# Patient Record
Sex: Female | Born: 1990 | Race: Black or African American | Hispanic: No | Marital: Single | Smoking: Current every day smoker
Health system: Southern US, Community
[De-identification: ages and names within clinical notes are randomized; demographics above are authoritative.]

## PROBLEM LIST (undated history)

## (undated) DIAGNOSIS — J45909 Unspecified asthma, uncomplicated: Secondary | ICD-10-CM

## (undated) DIAGNOSIS — I10 Essential (primary) hypertension: Secondary | ICD-10-CM

## (undated) DIAGNOSIS — S7290XA Unspecified fracture of unspecified femur, initial encounter for closed fracture: Secondary | ICD-10-CM

## (undated) HISTORY — DX: Essential (primary) hypertension: I10

## (undated) HISTORY — PX: HIP SURGERY: SHX245

---

## 2017-09-30 ENCOUNTER — Encounter: Payer: Self-pay | Admitting: Emergency Medicine

## 2017-09-30 ENCOUNTER — Emergency Department
Admission: EM | Admit: 2017-09-30 | Discharge: 2017-09-30 | Disposition: A | Discharge status: Home / Self Care | Payer: Self-pay | Attending: Emergency Medicine | Admitting: Emergency Medicine

## 2017-09-30 ENCOUNTER — Other Ambulatory Visit: Payer: Self-pay

## 2017-09-30 DIAGNOSIS — H66001 Acute suppurative otitis media without spontaneous rupture of ear drum, right ear: Secondary | ICD-10-CM | POA: Insufficient documentation

## 2017-09-30 DIAGNOSIS — J069 Acute upper respiratory infection, unspecified: Secondary | ICD-10-CM | POA: Insufficient documentation

## 2017-09-30 DIAGNOSIS — F17290 Nicotine dependence, other tobacco product, uncomplicated: Secondary | ICD-10-CM | POA: Insufficient documentation

## 2017-09-30 MED ORDER — GUAIFENESIN-CODEINE 100-10 MG/5ML PO SOLN: 10.0000 mL | Freq: Three times a day (TID) | ORAL | 0 refills | Status: DC | PRN

## 2017-09-30 MED ORDER — AMOXICILLIN 500 MG PO TABS: 500.0000 mg | ORAL_TABLET | Freq: Three times a day (TID) | ORAL | 0 refills | Status: DC

## 2017-09-30 NOTE — ED Triage Notes (Signed)
Cough x 5 days

## 2017-09-30 NOTE — ED Provider Notes (Signed)
[facility] Emergency Department Provider Note  ____________________________________________  Time seen: Approximately 9:40 AM  I have reviewed the triage vital signs and the nursing notes.   HISTORY  Chief Complaint Cough   HPI Sandra Berger is a 27 y.o. female who presents to the emergency department for treatment and evaluation of cough for the past 5 days.  Patient's states that her child became ill the day before she did.  They were exposed to children who had the same symptoms.  Patient denies fever.  Cough seems to be worse at night.  Over the past 24 hours, her ears have begun to hurt as well. She has not been able to sleep because of the cough. OTC medications have not relieved the pain.  History reviewed. No pertinent past medical history.  There are no active problems to display for this patient.   History reviewed. No pertinent surgical history.  Prior to Admission medications   Medication Sig Start Date End Date Taking? Authorizing Provider  amoxicillin (AMOXIL) 500 MG tablet Take 1 tablet (500 mg total) by mouth 3 (three) times daily. 09/30/17   Codylee Patil B, FNP  guaiFENesin-codeine 100-10 MG/5ML syrup Take 10 mLs by mouth 3 (three) times daily as needed. 09/30/17   Chinita Pesterriplett, Areeb Corron B, FNP    Allergies Percocet [oxycodone-acetaminophen]  No family history on file.  Social History Social History   Tobacco Use  . Smoking status: Current Every Day Smoker    Types: Cigars  Substance Use Topics  . Alcohol use: Not on file  . Drug use: Not on file    Review of Systems Constitutional: Negative for fever/chills ENT: Negative for sore throat. Cardiovascular: Denies chest pain. Respiratory: Negative for shortness of breath. Positive for cough. Gastrointestinal: Negative for nausea,  no vomiting.  Negative for diarrhea.  Musculoskeletal: Negative for body aches Skin: Negative for rash. Neurological: Negative for  headaches ____________________________________________   PHYSICAL EXAM:  VITAL SIGNS: ED Triage Vitals  Enc Vitals Group     BP 09/30/17 0823 (!) 136/91     Pulse Rate 09/30/17 0823 94     Resp 09/30/17 0823 18     Temp 09/30/17 0823 99 F (37.2 C)     Temp Source 09/30/17 0823 Oral     SpO2 09/30/17 0823 98 %     Weight 09/30/17 0825 280 lb (127 kg)     Height 09/30/17 0825 5\' 2"  (1.575 m)     Head Circumference --      Peak Flow --      Pain Score 09/30/17 0825 7     Pain Loc --      Pain Edu? --      Excl. in GC? --     Constitutional: Alert and oriented. Well appearing and in no acute distress. Eyes: Conjunctivae are normal. EOMI. Ears: Right tympanic membrane is erythematous, retracted, and dull. Left tympanic membrane shows serous fluid behind the TM. Nose: Sinus congestion noted; no rhinnorhea. Mouth/Throat: Mucous membranes are moist.  Oropharynx clear. Tonsils normal. Neck: No stridor.  Lymphatic: No cervical lymphadenopathy. Cardiovascular: Normal rate, regular rhythm. Good peripheral circulation. Respiratory: Normal respiratory effort.  No retractions. Breath sounds clear to auscultation. Gastrointestinal: Soft and nontender.  Musculoskeletal: FROM x 4 extremities.  Neurologic:  Normal speech and language.  Skin:  Skin is warm, dry and intact. No rash noted. Psychiatric: Mood and affect are normal. Speech and behavior are normal.  ____________________________________________   LABS (all labs ordered are listed,  but only abnormal results are displayed)  Labs Reviewed - No data to display ____________________________________________  EKG  Not indicated. ____________________________________________  RADIOLOGY  Not indicated. ____________________________________________   PROCEDURES  Procedure(s) performed: None  Critical Care performed: No ____________________________________________   INITIAL IMPRESSION / ASSESSMENT AND PLAN / ED  COURSE  27 year old female presenting to the emergency department for treatment and evaluation of symptoms and exam most concerning for an acute otitis media in the right ear  as a result of an acute upper respiratory infection.  She will be given a prescription for amoxicillin and she was instructed to return to the emergency department for symptoms of change or worsen if she is unable to schedule an appointment.  Guaifenesin with codeine.  She was instructed to follow-up with her primary care provider for symptoms that are not improving over the next few days.  Medications - No data to display  ED Discharge Orders        Ordered    amoxicillin (AMOXIL) 500 MG tablet  3 times daily     09/30/17 0958    guaiFENesin-codeine 100-10 MG/5ML syrup  3 times daily PRN     09/30/17 0958      Pertinent labs & imaging results that were available during my care of the patient were reviewed by me and considered in my medical decision making (see chart for details).    If controlled substance prescribed during this visit, 12 month history viewed on the [facility] prior to issuing an initial prescription for Schedule II or III opiod. ____________________________________________   FINAL CLINICAL IMPRESSION(S) / ED DIAGNOSES  Final diagnoses:  Acute upper respiratory infection  Acute suppurative otitis media of right ear without spontaneous rupture of tympanic membrane, recurrence not specified    Note:  This document was prepared using Dragon voice recognition software and may include unintentional dictation errors.     Chinita Pester, FNP 09/30/17 1354    Myrna Blazer, MD 09/30/17 [phone]

## 2017-09-30 NOTE — ED Notes (Signed)
NAD noted at time of D/C. Pt denies questions or concerns. Pt ambulatory to the lobby at this time. NP made aware of patient's BP, pt instructed to follow up with PCP for BP recheck. Pt states understanding.

## 2018-10-22 ENCOUNTER — Ambulatory Visit
Admission: EM | Admit: 2018-10-22 | Discharge: 2018-10-22 | Disposition: A | Discharge status: Home / Self Care | Payer: Self-pay | Attending: Family Medicine | Admitting: Family Medicine

## 2018-10-22 ENCOUNTER — Other Ambulatory Visit: Payer: Self-pay

## 2018-10-22 DIAGNOSIS — J301 Allergic rhinitis due to pollen: Secondary | ICD-10-CM

## 2018-10-22 DIAGNOSIS — F17210 Nicotine dependence, cigarettes, uncomplicated: Secondary | ICD-10-CM

## 2018-10-22 DIAGNOSIS — R062 Wheezing: Secondary | ICD-10-CM

## 2018-10-22 MED ORDER — ALBUTEROL SULFATE HFA 108 (90 BASE) MCG/ACT IN AERS: 2.0000 | INHALATION_SPRAY | Freq: Four times a day (QID) | RESPIRATORY_TRACT | 0 refills | Status: DC | PRN

## 2018-10-22 NOTE — ED Provider Notes (Signed)
[facility]    CSN: [id number] Arrival date & time: 10/22/18  1201     History   Chief Complaint Chief Complaint  Patient presents with  . Allergies    HPI Sandra Berger is a 28 y.o. female.   28 year old female presents with runny nose, nasal congestion and sneezing that started about 3 to 4 days ago. Also having a mild cough and wheezing due to post nasal drainage. Denies any fever or GI symptoms. She has taken generic Claritin (uncertain if it has the decongestant in it), Dayquil/Nyquil and Benadryl with some relief. Missed work 2 days ago and needs a note to return to work. Has history of asthma and allergies but does not have a current inhaler. No other chronic health issues. Takes no daily medications.   The history is provided by the patient.    History reviewed. No pertinent past medical history.  There are no active problems to display for this patient.   Past Surgical History:  Procedure Laterality Date  . HIP SURGERY Left     OB History   No obstetric history on file.      Home Medications    Prior to Admission medications   Medication Sig Start Date End Date Taking? Authorizing Provider  albuterol (PROVENTIL HFA;VENTOLIN HFA) 108 (90 Base) MCG/ACT inhaler Inhale 2 puffs into the lungs every 6 (six) hours as needed for wheezing. 10/22/18   Alissandra Geoffroy, Ali Lowe, NP    Family History Family History  Problem Relation Age of Onset  . Heart failure Mother   . Irritable bowel syndrome Mother   . Hypertension Mother   . Anxiety disorder Mother     Social History Social History   Tobacco Use  . Smoking status: Current Every Day Smoker    Types: Cigars  . Smokeless tobacco: Never Used  Substance Use Topics  . Alcohol use: Yes    Comment: occasionally  . Drug use: Not Currently     Allergies   Percocet [oxycodone-acetaminophen]   Review of Systems Review of Systems  Constitutional: Positive for appetite change (slight decreased ) and  fatigue. Negative for activity change, chills and fever.  HENT: Positive for congestion (nasal), postnasal drip, rhinorrhea and sneezing. Negative for dental problem, ear discharge, ear pain, facial swelling, mouth sores, nosebleeds, sinus pressure, sinus pain, sore throat and trouble swallowing.   Eyes: Negative for pain, discharge, redness and itching.  Respiratory: Positive for cough and wheezing. Negative for chest tightness and shortness of breath.   Gastrointestinal: Negative for abdominal pain, diarrhea, nausea and vomiting.  Musculoskeletal: Negative for arthralgias, myalgias, neck pain and neck stiffness.  Skin: Negative for color change, rash and wound.  Allergic/Immunologic: Positive for environmental allergies. Negative for immunocompromised state.  Neurological: Negative for dizziness, tremors, seizures, syncope, weakness, light-headedness, numbness and headaches.  Hematological: Negative for adenopathy. Does not bruise/bleed easily.     Physical Exam Triage Vital Signs ED Triage Vitals  Enc Vitals Group     BP 10/22/18 1317 131/75     Pulse Rate 10/22/18 1317 88     Resp 10/22/18 1317 18     Temp 10/22/18 1317 98.2 F (36.8 C)     Temp Source 10/22/18 1317 Oral     SpO2 10/22/18 1317 99 %     Weight 10/22/18 1314 288 lb (130.6 kg)     Height 10/22/18 1314  (1.575 m)     Head Circumference --      Peak  Flow --      Pain Score 10/22/18 1314 2     Pain Loc --      Pain Edu? --      Excl. in GC? --    No data found.  Updated Vital Signs BP 131/75 (BP Location: Right Arm)   Pulse 88   Temp 98.2 F (36.8 C) (Oral)   Resp 18   Ht 5\' 2"  (1.575 m)   Wt 288 lb (130.6 kg)   SpO2 99%   BMI 52.68 kg/m   Visual Acuity Right Eye Distance:   Left Eye Distance:   Bilateral Distance:    Right Eye Near:   Left Eye Near:    Bilateral Near:     Physical Exam Vitals signs and nursing note reviewed.  Constitutional:      General: She is awake. She is not in  acute distress.    Appearance: She is well-developed and well-groomed.     Comments: She is sitting comfortably in exam chair in no acute distress but appears tired.   HENT:     Head: Normocephalic and atraumatic.     Right Ear: Hearing, tympanic membrane, ear canal and external ear normal.     Left Ear: Hearing, tympanic membrane, ear canal and external ear normal.     Nose: Mucosal edema and rhinorrhea (clear) present. Rhinorrhea is clear.     Right Sinus: No maxillary sinus tenderness or frontal sinus tenderness.     Left Sinus: No maxillary sinus tenderness or frontal sinus tenderness.     Mouth/Throat:     Lips: Pink.     Mouth: Mucous membranes are moist.     Pharynx: Oropharynx is clear. Uvula midline. No pharyngeal swelling, oropharyngeal exudate, posterior oropharyngeal erythema or uvula swelling.  Eyes:     Extraocular Movements: Extraocular movements intact.     Conjunctiva/sclera: Conjunctivae normal.  Neck:     Musculoskeletal: Normal range of motion and neck supple. No neck rigidity or muscular tenderness.  Cardiovascular:     Rate and Rhythm: Normal rate and regular rhythm.     Heart sounds: Normal heart sounds. No murmur.  Pulmonary:     Effort: Pulmonary effort is normal. No respiratory distress.     Breath sounds: Normal air entry. No decreased air movement. Examination of the right-upper field reveals wheezing. Examination of the left-upper field reveals wheezing. Examination of the right-lower field reveals wheezing. Examination of the left-lower field reveals wheezing. Wheezing present. No decreased breath sounds, rhonchi or rales.  Musculoskeletal: Normal range of motion.  Lymphadenopathy:     Cervical: No cervical adenopathy.  Skin:    General: Skin is warm and dry.     Capillary Refill: Capillary refill takes less than 2 seconds.  Neurological:     General: No focal deficit present.     Mental Status: She is alert and oriented to person, place, and time.   Psychiatric:        Mood and Affect: Mood normal.        Behavior: Behavior normal. Behavior is cooperative.        Thought Content: Thought content normal.        Judgment: Judgment normal.      UC Treatments / Results  Labs (all labs ordered are listed, but only abnormal results are displayed) Labs Reviewed - No data to display  EKG None  Radiology No results found.  Procedures Procedures (including critical care time)  Medications Ordered in UC Medications -  No data to display  Initial Impression / Assessment and Plan / UC Course  I have reviewed the triage vital signs and the nursing notes.  Pertinent labs & imaging results that were available during my care of the patient were reviewed by me and considered in my medical decision making (see chart for details).    Discussed that she probably has seasonal allergies with mild asthma component. May have a mild viral URI but not contagious at this point since no fever. Recommend use Albuterol inhaler 2 puffs every 6 hours as needed for wheezing. Recommend Claritin-D twice a day as directed. Rest. May use Benadryl at night if needed to help sleep. Note written that she can go back to work as scheduled with no restrictions. Follow-up in 4 to 5 days if not improving.   Final Clinical Impressions(s) / UC Diagnoses   Final diagnoses:  Seasonal allergic rhinitis due to pollen  Wheezing     Discharge Instructions     Recommend start Albuterol inhaler 2 puffs every 6 hours as needed for wheezing or cough. Continue Claritin-D as directed for congestion. Follow-up in 4 to 5 days if not improving.     ED Prescriptions    Medication Sig Dispense Auth. Provider   albuterol (PROVENTIL HFA;VENTOLIN HFA) 108 (90 Base) MCG/ACT inhaler Inhale 2 puffs into the lungs every 6 (six) hours as needed for wheezing. 1 Inhaler Marlies Ligman, Ali Lowe, NP     Controlled Substance Prescriptions [place] Controlled Substance Registry consulted? Not  Applicable   Sudie Grumbling, NP 10/22/18 1757

## 2018-10-22 NOTE — ED Triage Notes (Signed)
Patient complains of runny nose, stuffy nose and a cough that started 3-4 days ago.

## 2018-10-22 NOTE — Discharge Instructions (Addendum)
Recommend start Albuterol inhaler 2 puffs every 6 hours as needed for wheezing or cough. Continue Claritin-D as directed for congestion. Follow-up in 4 to 5 days if not improving.

## 2018-10-25 ENCOUNTER — Encounter: Payer: Self-pay | Admitting: Emergency Medicine

## 2018-10-25 ENCOUNTER — Ambulatory Visit
Admission: EM | Admit: 2018-10-25 | Discharge: 2018-10-25 | Disposition: A | Discharge status: Home / Self Care | Payer: Medicaid Other | Attending: Family Medicine | Admitting: Family Medicine

## 2018-10-25 ENCOUNTER — Other Ambulatory Visit: Payer: Self-pay

## 2018-10-25 DIAGNOSIS — F17210 Nicotine dependence, cigarettes, uncomplicated: Secondary | ICD-10-CM

## 2018-10-25 DIAGNOSIS — J45909 Unspecified asthma, uncomplicated: Secondary | ICD-10-CM

## 2018-10-25 DIAGNOSIS — J309 Allergic rhinitis, unspecified: Secondary | ICD-10-CM

## 2018-10-25 MED ORDER — AZITHROMYCIN 250 MG PO TABS: ORAL_TABLET | ORAL | 0 refills | Status: DC

## 2018-10-25 MED ORDER — METHYLPREDNISOLONE 4 MG PO TBPK: ORAL_TABLET | ORAL | 0 refills | Status: DC

## 2018-10-25 MED ORDER — FLUTICASONE PROPIONATE 50 MCG/ACT NA SUSP: 1.0000 | Freq: Every day | NASAL | 2 refills | Status: DC

## 2018-10-25 NOTE — ED Provider Notes (Signed)
[facility]    CSN: [id number] Arrival date & time: 10/25/18  0825     History   Chief Complaint Chief Complaint  Patient presents with  . Cough    HPI Sandra Berger is a 28 y.o. female.   Patient is a 28 year old female who presents with complaint of cough.  Patient was seen here on March 23 with similar symptoms.  Patient given diagnosis of seasonal allergies as well as a cough.  She is noted to have wheezing in all 4 quadrants then and was given prescription for albuterol.  Patient states she has been taking the albuterol to 3 times a day and it has helped some with her cough on Monday and Tuesday.  However she states the cough got worse yesterday.  Also reporting that she has a sensation of her ears feeling clogged and decreased hearing.  Patient also reports Wednesday and last night but felt like her chest was little heavy with chest pain related to her cough.  Patient reports her cough is non-productive and she has not had any fevers.  Patient reports her daughter has had some similar symptoms.  She does take Claritin daily.  Again patient denies any travel.     History reviewed. No pertinent past medical history.  There are no active problems to display for this patient.   Past Surgical History:  Procedure Laterality Date  . HIP SURGERY Left     OB History   No obstetric history on file.      Home Medications    Prior to Admission medications   Medication Sig Start Date End Date Taking? Authorizing Provider  albuterol (PROVENTIL HFA;VENTOLIN HFA) 108 (90 Base) MCG/ACT inhaler Inhale 2 puffs into the lungs every 6 (six) hours as needed for wheezing. 10/22/18  Yes Amyot, Ali Lowe, NP  azithromycin (ZITHROMAX Z-PAK) 250 MG tablet Take 2 tablets by mouth the first day followed by one tablet daily for next 4 days. 10/25/18   Candis Schatz, PA-C  fluticasone (FLONASE) 50 MCG/ACT nasal spray Place 1 spray into both nostrils daily. 10/25/18   Candis Schatz, PA-C  methylPREDNISolone (MEDROL DOSEPAK) 4 MG TBPK tablet Take per directions on packaging 10/25/18   Candis Schatz, PA-C    Family History Family History  Problem Relation Age of Onset  . Heart failure Mother   . Irritable bowel syndrome Mother   . Hypertension Mother   . Anxiety disorder Mother     Social History Social History   Tobacco Use  . Smoking status: Current Every Day Smoker    Types: Cigars  . Smokeless tobacco: Never Used  Substance Use Topics  . Alcohol use: Yes    Comment: occasionally  . Drug use: Not Currently     Allergies   Percocet [oxycodone-acetaminophen]   Review of Systems Review of Systems  As noted above in HPI.  Other systems reviewed and found to be negative.   Physical Exam Triage Vital Signs ED Triage Vitals  Enc Vitals Group     BP 10/25/18 0838 (!) 146/85     Pulse Rate 10/25/18 0838 (!) 106     Resp 10/25/18 0838 20     Temp 10/25/18 0838 98.8 F (37.1 C)     Temp Source 10/25/18 0838 Oral     SpO2 10/25/18 0838 96 %     Weight 10/25/18 0834 287 lb (130.2 kg)     Height 10/25/18 0834 5\' 2"  (1.575 m)  Head Circumference --      Peak Flow --      Pain Score 10/25/18 0834 2     Pain Loc --      Pain Edu? --      Excl. in GC? --    No data found.  Updated Vital Signs BP (!) 146/85 (BP Location: Left Arm)   Pulse (!) 106   Temp 98.8 F (37.1 C) (Oral)   Resp 20   Ht 5\' 2"  (1.575 m)   Wt 287 lb (130.2 kg)   SpO2 96%   BMI 52.49 kg/m    Physical Exam Constitutional:      Appearance: Normal appearance. She is obese.  HENT:     Right Ear: A middle ear effusion is present.     Left Ear: Tympanic membrane normal.  No middle ear effusion.     Nose:     Right Sinus: No maxillary sinus tenderness or frontal sinus tenderness.     Left Sinus: No maxillary sinus tenderness or frontal sinus tenderness.     Mouth/Throat:     Mouth: Mucous membranes are moist.  Eyes:     Extraocular Movements: Extraocular  movements intact.     Pupils: Pupils are equal, round, and reactive to light.  Cardiovascular:     Rate and Rhythm: Normal rate and regular rhythm.  Pulmonary:     Effort: Pulmonary effort is normal.     Breath sounds: Normal breath sounds. No stridor. No rhonchi.     Comments: Mid inspiratory squeak in all 4 quadrants Abdominal:     Palpations: Abdomen is soft.  Skin:    General: Skin is dry.  Neurological:     General: No focal deficit present.     Mental Status: She is alert and oriented to person, place, and time.  Psychiatric:        Behavior: Behavior normal.      UC Treatments / Results  Labs (all labs ordered are listed, but only abnormal results are displayed) Labs Reviewed - No data to display  EKG None  Radiology No results found.  Procedures Procedures (including critical care time)  Medications Ordered in UC Medications - No data to display  Initial Impression / Assessment and Plan / UC Course  I have reviewed the triage vital signs and the nursing notes.  Pertinent labs & imaging results that were available during my care of the patient were reviewed by me and considered in my medical decision making (see chart for details).    Patient reports worsening cough since she was seen here on Monday.  Despite the albuterol.  Patient continues to have wheezing.  She does have saturation 96% on room air.  Patient also reports some clogging of her right ear.  She does have some middle ear effusion.  Good give her a prescription for Medrol Dosepak as well as azithromycin given her history of asthma.  Have her continue albuterol and will also add Flonase.  Final Clinical Impressions(s) / UC Diagnoses   Final diagnoses:  Asthma, unspecified asthma severity, unspecified whether complicated, unspecified whether persistent  Allergic rhinitis, unspecified seasonality, unspecified trigger     Discharge Instructions     -Continue the albuterol as needed -Flonase: 1  spray to each nostril daily -Azithromycin as directed on the package -Medrol dose pack: Short steroid taper as prescribed on package -Would change her Claritin to nightly    ED Prescriptions    Medication Sig Dispense Auth. Provider  methylPREDNISolone (MEDROL DOSEPAK) 4 MG TBPK tablet Take per directions on packaging 21 tablet Candis Schatz, PA-C   azithromycin (ZITHROMAX Z-PAK) 250 MG tablet Take 2 tablets by mouth the first day followed by one tablet daily for next 4 days. 6 tablet Candis Schatz, PA-C   fluticasone [facility]) 50 MCG/ACT nasal spray Place 1 spray into both nostrils daily. 16 g Candis Schatz, PA-C     Controlled Substance Prescriptions [place] Controlled Substance Registry consulted? Not Applicable   Candis Schatz, PA-C 10/25/18 1478

## 2018-10-25 NOTE — ED Triage Notes (Signed)
Pt c/o cough, chest tightness and wheezing. She was seen on 10/22/18 and told it was allergies. She states that the cough has gotten worse. Denies fever (has had it checked at work), body aches, or chills.

## 2018-10-25 NOTE — Discharge Instructions (Signed)
-  Continue the albuterol as needed -Flonase: 1 spray to each nostril daily -Azithromycin as directed on the package -Medrol dose pack: Short steroid taper as prescribed on package -Would change her Claritin to nightly

## 2019-02-22 ENCOUNTER — Other Ambulatory Visit: Payer: Self-pay

## 2019-02-22 ENCOUNTER — Emergency Department
Admission: EM | Admit: 2019-02-22 | Discharge: 2019-02-22 | Disposition: A | Discharge status: Home / Self Care | Payer: BC Managed Care – PPO | Attending: Emergency Medicine | Admitting: Emergency Medicine

## 2019-02-22 ENCOUNTER — Emergency Department: Payer: BC Managed Care – PPO

## 2019-02-22 DIAGNOSIS — F17290 Nicotine dependence, other tobacco product, uncomplicated: Secondary | ICD-10-CM | POA: Insufficient documentation

## 2019-02-22 DIAGNOSIS — M545 Low back pain, unspecified: Secondary | ICD-10-CM

## 2019-02-22 DIAGNOSIS — Z79899 Other long term (current) drug therapy: Secondary | ICD-10-CM | POA: Diagnosis not present

## 2019-02-22 LAB — URINALYSIS, COMPLETE (UACMP) WITH MICROSCOPIC
Bacteria, UA: NONE SEEN
Bilirubin Urine: NEGATIVE
Glucose, UA: NEGATIVE mg/dL
Hgb urine dipstick: NEGATIVE
Ketones, ur: NEGATIVE mg/dL
Leukocytes,Ua: NEGATIVE
Nitrite: NEGATIVE
Protein, ur: NEGATIVE mg/dL
Specific Gravity, Urine: 1.026 (ref 1.005–1.030)
pH: 6 (ref 5.0–8.0)

## 2019-02-22 LAB — PREGNANCY, URINE: Preg Test, Ur: NEGATIVE

## 2019-02-22 MED ORDER — METHOCARBAMOL 500 MG PO TABS: 500.0000 mg | ORAL_TABLET | Freq: Three times a day (TID) | ORAL | 0 refills | Status: AC | PRN

## 2019-02-22 MED ORDER — KETOROLAC TROMETHAMINE 10 MG PO TABS: 10.0000 mg | ORAL_TABLET | Freq: Four times a day (QID) | ORAL | 0 refills | Status: AC | PRN

## 2019-02-22 MED ORDER — KETOROLAC TROMETHAMINE 30 MG/ML IJ SOLN
30.0000 mg | Freq: Once | INTRAMUSCULAR | Status: AC
  Administered 2019-02-22: 22:00:00 30 mg via INTRAMUSCULAR
  Filled 2019-02-22: qty 1

## 2019-02-22 MED ORDER — METHOCARBAMOL 500 MG PO TABS
500.0000 mg | ORAL_TABLET | Freq: Once | ORAL | Status: AC
  Administered 2019-02-22: 500 mg via ORAL
  Filled 2019-02-22: qty 1

## 2019-02-22 NOTE — ED Provider Notes (Signed)
[facility] Emergency Department Provider Note  ____________________________________________  Time seen: Approximately 10:29 PM  I have reviewed the triage vital signs and the nursing notes.   HISTORY  Chief Complaint Back Pain    HPI Sandra Berger is a 28 y.o. female presents to the emergency department with acute and aching 8 out of 10 low back pain for the past week.  Patient denies falls or mechanisms of trauma.  No radiation of pain to the bilateral lower extremities.  Patient denies dysuria, hematuria, increased urinary frequency, nausea, vomiting or concern for possible pregnancy.  No other alleviating measures have been attempted.        History reviewed. No pertinent past medical history.  There are no active problems to display for this patient.   Past Surgical History:  Procedure Laterality Date  . HIP SURGERY Left     Prior to Admission medications   Medication Sig Start Date End Date Taking? Authorizing Provider  albuterol (PROVENTIL HFA;VENTOLIN HFA) 108 (90 Base) MCG/ACT inhaler Inhale 2 puffs into the lungs every 6 (six) hours as needed for wheezing. 10/22/18   Amyot, Ali LoweAnn Berry, NP  azithromycin (ZITHROMAX Z-PAK) 250 MG tablet Take 2 tablets by mouth the first day followed by one tablet daily for next 4 days. 10/25/18   Candis SchatzHarris, Michael D, PA-C  fluticasone (FLONASE) 50 MCG/ACT nasal spray Place 1 spray into both nostrils daily. 10/25/18   Candis SchatzHarris, Michael D, PA-C  ketorolac (TORADOL) 10 MG tablet Take 1 tablet (10 mg total) by mouth every 6 (six) hours as needed for up to 5 days. 02/22/19 02/27/19  Orvil FeilWoods, Masoud Nyce M, PA-C  methocarbamol (ROBAXIN) 500 MG tablet Take 1 tablet (500 mg total) by mouth every 8 (eight) hours as needed for up to 5 days. 02/22/19 02/27/19  Orvil FeilWoods, Derita Michelsen M, PA-C  methylPREDNISolone (MEDROL DOSEPAK) 4 MG TBPK tablet Take per directions on packaging 10/25/18   Candis SchatzHarris, Michael D, PA-C    Allergies Penicillins and Percocet  [oxycodone-acetaminophen]  Family History  Problem Relation Age of Onset  . Heart failure Mother   . Irritable bowel syndrome Mother   . Hypertension Mother   . Anxiety disorder Mother     Social History Social History   Tobacco Use  . Smoking status: Current Every Day Smoker    Types: Cigars  . Smokeless tobacco: Never Used  Substance Use Topics  . Alcohol use: Yes    Comment: occasionally  . Drug use: Not Currently     Review of Systems  Constitutional: No fever/chills Eyes: No visual changes. No discharge ENT: No upper respiratory complaints. Cardiovascular: no chest pain. Respiratory: no cough. No SOB. Gastrointestinal: No abdominal pain.  No nausea, no vomiting.  No diarrhea.  No constipation. Genitourinary: Negative for dysuria. No hematuria Musculoskeletal: Patient has low back pain.  Skin: Negative for rash, abrasions, lacerations, ecchymosis. Neurological: Negative for headaches, focal weakness or numbness.   ____________________________________________   PHYSICAL EXAM:  VITAL SIGNS: ED Triage Vitals  Enc Vitals Group     BP 02/22/19 1829 (!) 182/107     Pulse Rate 02/22/19 1829 89     Resp 02/22/19 1829 18     Temp 02/22/19 1829 99.7 F (37.6 C)     Temp Source 02/22/19 1829 Oral     SpO2 02/22/19 1829 98 %     Weight 02/22/19 1834 298 lb (135.2 kg)     Height 02/22/19 1834 5\' 2"  (1.575 m)     Head Circumference --  Peak Flow --      Pain Score 02/22/19 1834 8     Pain Loc --      Pain Edu? --      Excl. in [place]? --      Constitutional: Alert and oriented. Well appearing and in no acute distress. Eyes: Conjunctivae are normal. PERRL. EOMI. Head: Atraumatic. Cardiovascular: Normal rate, regular rhythm. Normal S1 and S2.  Good peripheral circulation. Respiratory: Normal respiratory effort without tachypnea or retractions. Lungs CTAB. Good air entry to the bases with no decreased or absent breath sounds. Gastrointestinal: Bowel sounds 4  quadrants. Soft and nontender to palpation. No guarding or rigidity. No palpable masses. No distention. No CVA tenderness. Musculoskeletal: Full range of motion to all extremities. No gross deformities appreciated. Patient has paraspinal muscle tenderness along the lumbar spine.  Neurologic:  Normal speech and language. No gross focal neurologic deficits are appreciated.  Skin:  Skin is warm, dry and intact. No rash noted. Psychiatric: Mood and affect are normal. Speech and behavior are normal. Patient exhibits appropriate insight and judgement.   ____________________________________________   LABS (all labs ordered are listed, but only abnormal results are displayed)  Labs Reviewed  URINALYSIS, COMPLETE (UACMP) WITH MICROSCOPIC - Abnormal; Notable for the following components:      Result Value   Color, Urine YELLOW (*)    APPearance HAZY (*)    All other components within normal limits  PREGNANCY, URINE   ____________________________________________  EKG   ____________________________________________  RADIOLOGY I personally viewed and evaluated these images as part of my medical decision making, as well as reviewing the written report by the radiologist.  Dg Lumbar Spine 2-3 Views  Result Date: 02/22/2019 CLINICAL DATA:  Low back pain for 1 week, no known injury EXAM: LUMBAR SPINE - 2-3 VIEW COMPARISON:  None. FINDINGS: No fracture or dislocation of the lumbar spine. Disc spaces and vertebral body heights are preserved. Normal lumbar lordosis. Unremarkable overlying soft tissues. IMPRESSION: No fracture or dislocation of the lumbar spine. Disc spaces and vertebral body heights are preserved. Normal lumbar lordosis. Unremarkable overlying soft tissues. MRI may be used to further evaluate for lumbar disc and neural foraminal pathology if indicated by localizing neurological signs and symptoms. Electronically Signed   By: Eddie Candle M.D.   On: 02/22/2019 21:32     ____________________________________________    PROCEDURES  Procedure(s) performed:    Procedures    Medications  ketorolac (TORADOL) 30 MG/ML injection 30 mg (30 mg Intramuscular Given 02/22/19 2202)  methocarbamol (ROBAXIN) tablet 500 mg (500 mg Oral Given 02/22/19 2201)     ____________________________________________   INITIAL IMPRESSION / ASSESSMENT AND PLAN / ED COURSE  Pertinent labs & imaging results that were available during my care of the patient were reviewed by me and considered in my medical decision making (see chart for details).  Review of the [place] CSRS was performed in accordance of the [place] prior to dispensing any controlled drugs.         Assessment and plan Low back pain 28 year old female presents to the emergency department with low back pain.  Patient was hypertensive but vital signs were otherwise stable.  Differential diagnosis includes cystitis, pregnancy and low back pain.  Pregnancy testing was negative.  Urinalysis was noncontributory for cystitis.  Patient was given Robaxin and Toradol in the emergency department. Lumbar x ray   She was discharged with Toradol and Robaxin.  All patient questions were answered.  ____________________________________________  FINAL CLINICAL IMPRESSION(S) /  ED DIAGNOSES  Final diagnoses:  Acute bilateral low back pain without sciatica      NEW MEDICATIONS STARTED DURING THIS VISIT:  ED Discharge Orders         Ordered    ketorolac (TORADOL) 10 MG tablet  Every 6 hours PRN     02/22/19 2224    methocarbamol (ROBAXIN) 500 MG tablet  Every 8 hours PRN     02/22/19 2224              This chart was dictated using voice recognition software/Dragon. Despite best efforts to proofread, errors can occur which can change the meaning. Any change was purely unintentional.    Orvil FeilWoods, Khyree Carillo M, PA-C 02/22/19 2243    Minna AntisPaduchowski, Kevin, MD 02/22/19 [phone]

## 2019-02-22 NOTE — ED Triage Notes (Signed)
Pt arrives to ed via POV from home. Ambulatory to triage, Pt c/o back pain starting 1 week ago. Pt reports taking alive, tylenol, gabapentin with no relief. NAD noted during triage.

## 2019-02-22 NOTE — ED Notes (Signed)
E-signature not working at this time. Pt verbalized understanding of D/C instructions and prescriptions. No further questions at this time. Pt ambulatory to lobby in NAD.

## 2019-05-05 ENCOUNTER — Encounter: Payer: Self-pay | Admitting: Emergency Medicine

## 2019-05-05 ENCOUNTER — Ambulatory Visit (INDEPENDENT_AMBULATORY_CARE_PROVIDER_SITE_OTHER): Payer: BC Managed Care – PPO

## 2019-05-05 ENCOUNTER — Other Ambulatory Visit: Payer: Self-pay

## 2019-05-05 ENCOUNTER — Ambulatory Visit
Admission: EM | Admit: 2019-05-05 | Discharge: 2019-05-05 | Disposition: A | Discharge status: Home / Self Care | Payer: BC Managed Care – PPO | Attending: Internal Medicine | Admitting: Internal Medicine

## 2019-05-05 DIAGNOSIS — F17200 Nicotine dependence, unspecified, uncomplicated: Secondary | ICD-10-CM | POA: Diagnosis not present

## 2019-05-05 DIAGNOSIS — R05 Cough: Secondary | ICD-10-CM | POA: Diagnosis not present

## 2019-05-05 DIAGNOSIS — R062 Wheezing: Secondary | ICD-10-CM

## 2019-05-05 DIAGNOSIS — Z1159 Encounter for screening for other viral diseases: Secondary | ICD-10-CM

## 2019-05-05 DIAGNOSIS — R0981 Nasal congestion: Secondary | ICD-10-CM

## 2019-05-05 DIAGNOSIS — R059 Cough, unspecified: Secondary | ICD-10-CM

## 2019-05-05 MED ORDER — ALBUTEROL SULFATE HFA 108 (90 BASE) MCG/ACT IN AERS: 1.0000 | INHALATION_SPRAY | Freq: Four times a day (QID) | RESPIRATORY_TRACT | 0 refills | Status: DC | PRN

## 2019-05-05 MED ORDER — BENZONATATE 200 MG PO CAPS: ORAL_CAPSULE | ORAL | 0 refills | Status: DC

## 2019-05-05 MED ORDER — PREDNISONE 20 MG PO TABS: ORAL_TABLET | ORAL | 0 refills | Status: DC

## 2019-05-05 NOTE — Discharge Instructions (Addendum)
Use a Claritin Allegra or Zyrtec daily.  Also consider using fluticasone nasal spray on a daily basis for 3 to 4 weeks.  If you worsen or not improving follow-up with your primary care physician or the emergency room.

## 2019-05-05 NOTE — ED Triage Notes (Addendum)
Patient in office today c/o cough while cleaning apartment 3d ago and stated that coughing has not stopped with congestion thinks it might be dust allergies.    APO:LIDCVUD, Dimetapp,onecare cold and flu

## 2019-05-05 NOTE — ED Provider Notes (Signed)
[facility]    CSN: [id number] Arrival date & time: 05/05/19  1104      History   Chief Complaint Chief Complaint  Patient presents with  . Cough  . Nasal Congestion    HPI Sandra Berger is a 28 y.o. female.   HPI  28 year old female smoker presents with a cough and wheezing after she cleaned her apartment about 3 days ago.  She states  the cough continues to bother her.  She denies any fever or chills although temperature today is 99.5.  O2 sats on room air 100%.  Pulse rate of 98 blood pressure 134/82.  She has no history of any lung disease.  She states she only smokes about 2 cigars /day.  Has been taking over-the-counter medications including DayQuil Dimetapp cold and flu medications but has not found any benefit.  He denies shortness of breath.  She thinks that she may be allergic to dust.  She states that she usually cleans her Apt. 2 times per year but does not always have the same symptoms.       History reviewed. No pertinent past medical history.  There are no active problems to display for this patient.   Past Surgical History:  Procedure Laterality Date  . HIP SURGERY Left     OB History   No obstetric history on file.      Home Medications    Prior to Admission medications   Medication Sig Start Date End Date Taking? Authorizing Provider  fluticasone (FLONASE) 50 MCG/ACT nasal spray Place 1 spray into both nostrils daily. 10/25/18  Yes Candis Schatz, PA-C  albuterol (VENTOLIN HFA) 108 (90 Base) MCG/ACT inhaler Inhale 1-2 puffs into the lungs every 6 (six) hours as needed for wheezing or shortness of breath. Use with spacer 05/05/19   Lutricia Feil, PA-C  benzonatate (TESSALON) 200 MG capsule Take one cap TID PRN cough 05/05/19   Lutricia Feil, PA-C  predniSONE (DELTASONE) 20 MG tablet Take 2 tablets (40 mg) daily by mouth 05/05/19   Lutricia Feil, PA-C    Family History Family History  Problem Relation Age of Onset  . Heart  failure Mother   . Irritable bowel syndrome Mother   . Hypertension Mother   . Anxiety disorder Mother     Social History Social History   Tobacco Use  . Smoking status: Current Every Day Smoker    Types: Cigars  . Smokeless tobacco: Never Used  Substance Use Topics  . Alcohol use: Yes    Comment: occasionally  . Drug use: Not Currently     Allergies   Penicillins and Percocet [oxycodone-acetaminophen]   Review of Systems Review of Systems  Constitutional: Positive for activity change. Negative for appetite change, chills, fatigue and fever.  HENT: Positive for congestion, ear pain, sinus pressure and sinus pain.   Respiratory: Positive for cough and wheezing. Negative for shortness of breath.   All other systems reviewed and are negative.    Physical Exam Triage Vital Signs ED Triage Vitals  Enc Vitals Group     BP 05/05/19 1122 134/82     Pulse Rate 05/05/19 1122 98     Resp --      Temp 05/05/19 1122 99.5 F (37.5 C)     Temp Source 05/05/19 1122 Oral     SpO2 05/05/19 1122 100 %     Weight 05/05/19 1117 288 lb (130.6 kg)     Height 05/05/19 1117 5\' 2"  (  1.575 m)     Head Circumference --      Peak Flow --      Pain Score 05/05/19 1116 0     Pain Loc --      Pain Edu? --      Excl. in [place]? --    No data found.  Updated Vital Signs BP 134/82 (BP Location: Left Arm)   Pulse 98   Temp 99.5 F (37.5 C) (Oral)   Ht 5\' 2"  (1.575 m)   Wt 288 lb (130.6 kg)   LMP 09/01/2018   SpO2 100%   BMI 52.68 kg/m   Visual Acuity Right Eye Distance:   Left Eye Distance:   Bilateral Distance:    Right Eye Near:   Left Eye Near:    Bilateral Near:     Physical Exam Vitals signs and nursing note reviewed.  Constitutional:      General: She is not in acute distress.    Appearance: Normal appearance. She is obese. She is not ill-appearing, toxic-appearing or diaphoretic.  HENT:     Head: Normocephalic and atraumatic.     Right Ear: Tympanic membrane, ear  canal and external ear normal.     Left Ear: Tympanic membrane, ear canal and external ear normal.     Nose: Nose normal. No congestion or rhinorrhea.     Mouth/Throat:     Mouth: Mucous membranes are moist.     Pharynx: Oropharynx is clear. No oropharyngeal exudate or posterior oropharyngeal erythema.  Eyes:     Conjunctiva/sclera: Conjunctivae normal.  Neck:     Musculoskeletal: Normal range of motion and neck supple. No neck rigidity or muscular tenderness.  Cardiovascular:     Rate and Rhythm: Normal rate and regular rhythm.     Heart sounds: Normal heart sounds.  Pulmonary:     Effort: Pulmonary effort is normal. No respiratory distress.     Breath sounds: No stridor. Wheezing present. No rhonchi or rales.  Musculoskeletal: Normal range of motion.  Lymphadenopathy:     Cervical: No cervical adenopathy.  Skin:    General: Skin is warm and dry.  Neurological:     General: No focal deficit present.     Mental Status: She is alert and oriented to person, place, and time.  Psychiatric:        Mood and Affect: Mood normal.        Behavior: Behavior normal.        Thought Content: Thought content normal.        Judgment: Judgment normal.      UC Treatments / Results  Labs (all labs ordered are listed, but only abnormal results are displayed) Labs Reviewed  NOVEL CORONAVIRUS, NAA (HOSP ORDER, SEND-OUT TO REF LAB; TAT 18-24 HRS)    EKG   Radiology Dg Chest 2 View  Result Date: 05/05/2019 CLINICAL DATA:  Cough and wheezing 4 days. EXAM: CHEST - 2 VIEW COMPARISON:  None. FINDINGS: Lungs are adequately inflated and otherwise clear. Cardiomediastinal silhouette and remainder the exam is unremarkable. IMPRESSION: No active cardiopulmonary disease. Electronically Signed   By: Elberta Fortisaniel  Boyle M.D.   On: 05/05/2019 13:33    Procedures Procedures (including critical care time)  Medications Ordered in UC Medications - No data to display  Initial Impression / Assessment and Plan  / UC Course  I have reviewed the triage vital signs and the nursing notes.  Pertinent labs & imaging results that were available during my care of the patient  were reviewed by me and considered in my medical decision making (see chart for details).   28 year old female presents with the onset of cough nasal congestion that she experienced after cleaning her apartment.  She thought this may be a reaction to the dust stirred up with the cleaning.  She has been afebrile.  She denies any shortness of breath.  The cough is intermittent.  Nonproductive. Does have wheezing on physical exam today.  She denies any previous lung problems.  She has not had any history of asthma.  I prescribed Flonase nasal spray for her nasal congestion given her Tessalon Perles for her cough and have given her an albuterol inhaler for her wheezing.  Use Claritin Allegra or Zyrtec on a daily basis as well.  Have also given her short course of prednisone 40 mg for 4 days.  If she is not improving or worsens she should follow-up with her primary care physician or go to the emergency room.  She of course may return to our clinic at any time.  Final Clinical Impressions(s) / UC Diagnoses   Final diagnoses:  Cough     Discharge Instructions     Use a Claritin Allegra or Zyrtec daily.  Also consider using fluticasone nasal spray on a daily basis for 3 to 4 weeks.  If you worsen or not improving follow-up with your primary care physician or the emergency room.    ED Prescriptions    Medication Sig Dispense Auth. Provider   benzonatate (TESSALON) 200 MG capsule Take one cap TID PRN cough 30 capsule Crecencio Mc P, PA-C   predniSONE (DELTASONE) 20 MG tablet Take 2 tablets (40 mg) daily by mouth 8 tablet Crecencio Mc P, PA-C   albuterol (VENTOLIN HFA) 108 (90 Base) MCG/ACT inhaler Inhale 1-2 puffs into the lungs every 6 (six) hours as needed for wheezing or shortness of breath. Use with spacer 8 g Lorin Picket, PA-C      PDMP not reviewed this encounter.   Lorin Picket, PA-C 05/05/19 1832

## 2019-05-07 LAB — NOVEL CORONAVIRUS, NAA (HOSP ORDER, SEND-OUT TO REF LAB; TAT 18-24 HRS): SARS-CoV-2, NAA: NOT DETECTED

## 2019-05-13 ENCOUNTER — Telehealth: Payer: Self-pay

## 2019-05-13 NOTE — Telephone Encounter (Signed)
TC to [place] with [place]. Notified of positive syphilis results from unknown plasma location. Results faxed to [place]. Aileen Fass, RN

## 2020-03-10 ENCOUNTER — Encounter: Payer: Self-pay | Admitting: Emergency Medicine

## 2020-03-10 ENCOUNTER — Other Ambulatory Visit: Payer: Self-pay

## 2020-03-10 ENCOUNTER — Emergency Department: Payer: Self-pay

## 2020-03-10 DIAGNOSIS — Z7951 Long term (current) use of inhaled steroids: Secondary | ICD-10-CM | POA: Insufficient documentation

## 2020-03-10 DIAGNOSIS — F17210 Nicotine dependence, cigarettes, uncomplicated: Secondary | ICD-10-CM | POA: Insufficient documentation

## 2020-03-10 DIAGNOSIS — S20311A Abrasion of right front wall of thorax, initial encounter: Secondary | ICD-10-CM | POA: Insufficient documentation

## 2020-03-10 DIAGNOSIS — Y92410 Unspecified street and highway as the place of occurrence of the external cause: Secondary | ICD-10-CM | POA: Insufficient documentation

## 2020-03-10 DIAGNOSIS — Y9389 Activity, other specified: Secondary | ICD-10-CM | POA: Insufficient documentation

## 2020-03-10 DIAGNOSIS — S92401A Displaced unspecified fracture of right great toe, initial encounter for closed fracture: Secondary | ICD-10-CM | POA: Insufficient documentation

## 2020-03-10 DIAGNOSIS — Y998 Other external cause status: Secondary | ICD-10-CM | POA: Insufficient documentation
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
## 2020-03-11 ENCOUNTER — Emergency Department
Admission: EM | Admit: 2020-03-11 | Discharge: 2020-03-11 | Disposition: A | Discharge status: Home / Self Care | Payer: Self-pay | Attending: Emergency Medicine | Admitting: Emergency Medicine

## 2020-03-11 DIAGNOSIS — S92401A Displaced unspecified fracture of right great toe, initial encounter for closed fracture: Secondary | ICD-10-CM

## 2020-03-11 DIAGNOSIS — S20311A Abrasion of right front wall of thorax, initial encounter: Secondary | ICD-10-CM

## 2020-03-11 MED ORDER — KETOROLAC TROMETHAMINE 60 MG/2ML IM SOLN
60.0000 mg | Freq: Once | INTRAMUSCULAR | Status: AC
  Administered 2020-03-11: 08:00:00 60 mg via INTRAMUSCULAR
  Filled 2020-03-11: qty 2

## 2020-03-11 MED ORDER — TRAMADOL HCL 50 MG PO TABS: 50.0000 mg | ORAL_TABLET | Freq: Four times a day (QID) | ORAL | 0 refills | Status: DC | PRN

## 2020-03-11 MED ORDER — ACETAMINOPHEN-CODEINE #3 300-30 MG PO TABS
1.0000 | ORAL_TABLET | Freq: Once | ORAL | Status: AC
  Administered 2020-03-11: 08:00:00 1 via ORAL
  Filled 2020-03-11: qty 1
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
## 2020-03-11 NOTE — Discharge Instructions (Signed)
Please take the tramadol 50mg  every 6 hrs as needed. You may also take ibuprofen 600mg  every 6-8 hrs for pain. Please follow up with podiatry for review.
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
## 2020-08-07 ENCOUNTER — Ambulatory Visit
Admission: EM | Admit: 2020-08-07 | Discharge: 2020-08-07 | Disposition: A | Discharge status: Home / Self Care | Payer: Commercial Managed Care - PPO | Attending: Physician Assistant | Admitting: Physician Assistant

## 2020-08-07 ENCOUNTER — Encounter: Payer: Self-pay | Admitting: Emergency Medicine

## 2020-08-07 ENCOUNTER — Other Ambulatory Visit: Payer: Self-pay

## 2020-08-07 DIAGNOSIS — R062 Wheezing: Secondary | ICD-10-CM | POA: Diagnosis not present

## 2020-08-07 DIAGNOSIS — U071 COVID-19: Secondary | ICD-10-CM | POA: Diagnosis not present

## 2020-08-07 DIAGNOSIS — J069 Acute upper respiratory infection, unspecified: Secondary | ICD-10-CM

## 2020-08-07 MED ORDER — PREDNISONE 50 MG PO TABS: ORAL_TABLET | ORAL | 0 refills | Status: DC

## 2020-08-07 MED ORDER — ALBUTEROL SULFATE HFA 108 (90 BASE) MCG/ACT IN AERS: 2.0000 | INHALATION_SPRAY | RESPIRATORY_TRACT | 0 refills | Status: DC | PRN

## 2020-08-07 MED ORDER — BENZONATATE 100 MG PO CAPS: 100.0000 mg | ORAL_CAPSULE | Freq: Three times a day (TID) | ORAL | 0 refills | Status: DC

## 2020-08-07 NOTE — ED Provider Notes (Signed)
[facility]    CSN: [id number] Arrival date & time: 08/07/20  1359      History   Chief Complaint Chief Complaint  Patient presents with  . Headache    HPI Sandra Berger is a 30 y.o. female onset of HA last week and thought it was due to low glucose and resolved. Again had other HA, but was not her sugar this time and resolved, and has come back off and on and developed a cough and nose congestion 5 days ago. Has been very tired. Has been taking mucinex, and other home remedies. Had a fever yesterday but did not check it since her thermometer broke. Has been wheezing. 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   History reviewed. No pertinent past medical history.  There are no problems to display for this patient.   Past Surgical History:  Procedure Laterality Date  . HIP SURGERY Left     OB History   No obstetric history on file.      Home Medications    Prior to Admission medications   Medication Sig Start Date End Date Taking? Authorizing Provider  albuterol (VENTOLIN HFA) 108 (90 Base) MCG/ACT inhaler Inhale 2 puffs into the lungs every 4 (four) hours as needed for wheezing or shortness of breath. 08/07/20  Yes Rodriguez-Southworth, Sunday Spillers, PA-C  benzonatate (TESSALON) 100 MG capsule Take 1 capsule (100 mg total) by mouth every 8 (eight) hours. 08/07/20  Yes Rodriguez-Southworth, Sunday Spillers, PA-C  predniSONE (DELTASONE) 50 MG tablet One qd for wheezing 08/07/20  Yes Rodriguez-Southworth, Sunday Spillers, PA-C  fluticasone (FLONASE) 50 MCG/ACT nasal spray Place 1 spray into both nostrils daily. 10/25/18 08/07/20  Luvenia Redden, PA-C    Family History Family History  Problem Relation Age of Onset  . Heart failure Mother   . Irritable bowel syndrome Mother   . Hypertension Mother   . Anxiety disorder Mother     Social History Social History   Tobacco Use  . Smoking status: Current Every Day Smoker    Types: Cigars  . Smokeless tobacco:  Never Used  Vaping Use  . Vaping Use: Never used  Substance Use Topics  . Alcohol use: Yes    Comment: occasionally  . Drug use: Not Currently     Allergies   Penicillins and Percocet [oxycodone-acetaminophen]   Review of Systems Review of Systems  Constitutional: Positive for chills, fatigue and fever. Negative for appetite change.  HENT: Positive for congestion, postnasal drip, rhinorrhea and sore throat. Negative for ear discharge and ear pain.   Eyes: Negative for discharge.  Respiratory: Positive for cough and wheezing. Negative for chest tightness and shortness of breath.   Cardiovascular: Negative for chest pain.  Gastrointestinal: Negative for diarrhea, nausea and vomiting.  Musculoskeletal: Negative for gait problem and myalgias.  Skin: Negative for rash.  Neurological: Positive for headaches.  Hematological: Negative for adenopathy.   Physical Exam Triage Vital Signs ED Triage Vitals  Enc Vitals Group     BP 08/07/20 1602 (!) 152/93     Pulse Rate 08/07/20 1602 81     Resp 08/07/20 1602 18     Temp 08/07/20 1602 98.6 F (37 C)     Temp Source 08/07/20 1602 Oral     SpO2 08/07/20 1602 100 %     Weight 08/07/20 1603 276 lb 7.3 oz (125.4 kg)     Height 08/07/20 1603 5\' 2"  (1.575 m)     Head Circumference --  Peak Flow --      Pain Score 08/07/20 1603 0     Pain Loc --      Pain Edu? --      Excl. in GC? --    No data found.  Updated Vital Signs BP (!) 152/93 (BP Location: Right Arm)   Pulse 81   Temp 98.6 F (37 C) (Oral)   Resp 18   Ht 5\' 2"  (1.575 m)   Wt 276 lb 7.3 oz (125.4 kg)   SpO2 100%   BMI 50.56 kg/m   Visual Acuity Right Eye Distance:   Left Eye Distance:   Bilateral Distance:    Right Eye Near:   Left Eye Near:    Bilateral Near:     Physical Exam  Physical Exam Vitals signs and nursing note reviewed.  Constitutional:      General: She is not in acute distress.    Appearance: Normal appearance. She is not ill-appearing,  toxic-appearing or diaphoretic.  HENT:     Head: Normocephalic.     Right Ear: Tympanic membrane, ear canal and external ear normal.     Left Ear: Tympanic membrane, ear canal and external ear normal.     Nose: Nose normal.     Mouth/Throat:     Mouth: Mucous membranes are moist.  Eyes:     General: No scleral icterus.       Right eye: No discharge.        Left eye: No discharge.     Conjunctiva/sclera: Conjunctivae normal.  Neck:     Musculoskeletal: Neck supple. No neck rigidity.  Cardiovascular:     Rate and Rhythm: Normal rate and regular rhythm.     Heart sounds: No murmur.  Pulmonary:     Effort: Pulmonary effort is normal.     Breath sounds: has mild wheezing at end of expiration on upper lungs Musculoskeletal: Normal range of motion.  Lymphadenopathy:     Cervical: No cervical adenopathy.  Skin:    General: Skin is warm and dry.     Coloration: Skin is not jaundiced.     Findings: No rash.  Neurological:     Mental Status: She is alert and oriented to person, place, and time.     Gait: Gait normal.  Psychiatric:        Mood and Affect: Mood normal.        Behavior: Behavior normal.        Thought Content: Thought content normal.        Judgment: Judgment normal.     UC Treatments / Results  Labs (all labs ordered are listed, but only abnormal results are displayed) Labs Reviewed  SARS CORONAVIRUS 2 (TAT 6-24 HRS)    EKG   Radiology No results found.  Procedures Procedures (including critical care time)  Medications Ordered in UC Medications - No data to display  Initial Impression / Assessment and Plan / UC Course  I have reviewed the triage vital signs and the nursing notes. URI with viral bronchitis Covid test is pending and will check Mychart for results.  I placed her on Albuterol inhaler, prednisone and tessalon perless.     Final Clinical Impressions(s) / UC Diagnoses   Final diagnoses:  Wheezing  Upper respiratory tract infection,  unspecified type     Discharge Instructions     Stay quarantined until your covid test is back.   If your Covid test ends up positive you may take the following  supplements to help your immune system be stronger to fight this viral infection Take Quarcetin 500 mg three times a day x 7 days with Zinc 50 mg ones a day x 7 days. The quarcetin is an antiviral and anti-inflammatory supplement which helps open the zinc channels in the cell to absorb Zinc. Zinc helps decrease the virus load in your body. Take Melatonin 6-10 mg at bed time which also helps support your immune system.  Also make sure to take Vit D 5,000 IU per day with a fatty meal and Vit C 5000 mg a day until you are completely better. To prevent viral illnesses your vitamin D should be between 60-80. Stay on Vitamin D 2,000  and C  1000 mg the rest of the season.  Don't lay around, keep active and walk as much as you are able to to prevent worsening of your symptoms.  Follow up with your family Dr next week.  If you get short of breath and you are able to check  your oxygen with a pulse oxygen meter, if it gets to 92% or less, you need to go to the hospital to be admitted. If you dont have one, come back here and we will assess you.      ED Prescriptions    Medication Sig Dispense Auth. Provider   predniSONE (DELTASONE) 50 MG tablet One qd for wheezing 5 tablet Rodriguez-Southworth, Nettie Elm, PA-C   albuterol (VENTOLIN HFA) 108 (90 Base) MCG/ACT inhaler Inhale 2 puffs into the lungs every 4 (four) hours as needed for wheezing or shortness of breath. 18 g Rodriguez-Southworth, Remmie Bembenek, PA-C   benzonatate (TESSALON) 100 MG capsule Take 1 capsule (100 mg total) by mouth every 8 (eight) hours. 21 capsule Rodriguez-Southworth, Nettie Elm, PA-C     PDMP not reviewed this encounter.   Garey Ham, Cordelia Poche 08/07/20 2034

## 2020-08-07 NOTE — Discharge Instructions (Signed)
Stay quarantined until your covid test is back.  If your Covid test ends up positive you may take the following supplements to help your immune system be stronger to fight this viral infection Take Quarcetin 500 mg three times a day x 7 days with Zinc 50 mg ones a day x 7 days. The quarcetin is an antiviral and anti-inflammatory supplement which helps open the zinc channels in the cell to absorb Zinc. Zinc helps decrease the virus load in your body. Take Melatonin 6-10 mg at bed time which also helps support your immune system.  Also make sure to take Vit D 5,000 IU per day with a fatty meal and Vit C 5000 mg a day until you are completely better. To prevent viral illnesses your vitamin D should be between 60-80. Stay on Vitamin D 2,000  and C  1000 mg the rest of the season.  Don't lay around, keep active and walk as much as you are able to to prevent worsening of your symptoms.  Follow up with your family Dr next week.  If you get short of breath and you are able to check  your oxygen with a pulse oxygen meter, if it gets to 92% or less, you need to go to the hospital to be admitted. If you dont have one, come back here and we will assess you.   

## 2020-08-07 NOTE — ED Triage Notes (Signed)
Pt c/o headache, cough. Fatigue and nasal congestion. Started about 2 days ago.

## 2020-08-08 LAB — SARS CORONAVIRUS 2 (TAT 6-24 HRS): SARS Coronavirus 2: POSITIVE — AB

## 2020-10-27 ENCOUNTER — Emergency Department (HOSPITAL_COMMUNITY): Payer: Commercial Managed Care - PPO

## 2020-10-27 ENCOUNTER — Encounter (HOSPITAL_COMMUNITY): Payer: Self-pay | Admitting: *Deleted

## 2020-10-27 ENCOUNTER — Emergency Department (HOSPITAL_COMMUNITY)
Admission: EM | Admit: 2020-10-27 | Discharge: 2020-10-27 | Disposition: A | Discharge status: Home / Self Care | Payer: Commercial Managed Care - PPO | Attending: Emergency Medicine | Admitting: Emergency Medicine

## 2020-10-27 ENCOUNTER — Other Ambulatory Visit: Payer: Self-pay

## 2020-10-27 DIAGNOSIS — Y92410 Unspecified street and highway as the place of occurrence of the external cause: Secondary | ICD-10-CM | POA: Insufficient documentation

## 2020-10-27 DIAGNOSIS — F17290 Nicotine dependence, other tobacco product, uncomplicated: Secondary | ICD-10-CM | POA: Diagnosis not present

## 2020-10-27 DIAGNOSIS — J45909 Unspecified asthma, uncomplicated: Secondary | ICD-10-CM | POA: Insufficient documentation

## 2020-10-27 DIAGNOSIS — Z7952 Long term (current) use of systemic steroids: Secondary | ICD-10-CM | POA: Diagnosis not present

## 2020-10-27 DIAGNOSIS — M25511 Pain in right shoulder: Secondary | ICD-10-CM | POA: Insufficient documentation

## 2020-10-27 HISTORY — DX: Unspecified asthma, uncomplicated: J45.909

## 2020-10-27 HISTORY — DX: Unspecified fracture of unspecified femur, initial encounter for closed fracture: S72.90XA

## 2020-10-27 MED ORDER — IBUPROFEN 400 MG PO TABS
600.0000 mg | ORAL_TABLET | Freq: Once | ORAL | Status: AC
  Administered 2020-10-27: 600 mg via ORAL
  Filled 2020-10-27: qty 1
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
## 2020-10-27 NOTE — Discharge Instructions (Signed)
You can take 600 mg of ibuprofen every 6 hours, you can take 1000 mg of Tylenol every 6 hours, you can alternate these every 3 or you can take them together.  

## 2020-10-27 NOTE — Social Work (Signed)
CSW arranged for transportation via [employer].  CSW updated Pt.
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
## 2021-07-16 ENCOUNTER — Other Ambulatory Visit: Payer: Self-pay

## 2021-07-16 ENCOUNTER — Ambulatory Visit
Admission: EM | Admit: 2021-07-16 | Discharge: 2021-07-16 | Disposition: A | Discharge status: Home / Self Care | Payer: Commercial Managed Care - PPO | Attending: Emergency Medicine | Admitting: Emergency Medicine

## 2021-07-16 DIAGNOSIS — J069 Acute upper respiratory infection, unspecified: Secondary | ICD-10-CM

## 2021-07-16 DIAGNOSIS — R051 Acute cough: Secondary | ICD-10-CM

## 2021-07-16 DIAGNOSIS — H66001 Acute suppurative otitis media without spontaneous rupture of ear drum, right ear: Secondary | ICD-10-CM

## 2021-07-16 MED ORDER — BENZONATATE 100 MG PO CAPS: 200.0000 mg | ORAL_CAPSULE | Freq: Three times a day (TID) | ORAL | 0 refills | Status: DC | PRN

## 2021-07-16 MED ORDER — PROMETHAZINE-DM 6.25-15 MG/5ML PO SYRP: 5.0000 mL | ORAL_SOLUTION | Freq: Four times a day (QID) | ORAL | 0 refills | Status: DC | PRN

## 2021-07-16 MED ORDER — DOXYCYCLINE HYCLATE 100 MG PO CAPS: 100.0000 mg | ORAL_CAPSULE | Freq: Two times a day (BID) | ORAL | 0 refills | Status: DC

## 2021-07-16 MED ORDER — IPRATROPIUM BROMIDE 0.06 % NA SOLN: 2.0000 | Freq: Four times a day (QID) | NASAL | 12 refills | Status: DC

## 2021-07-16 MED ORDER — ALBUTEROL SULFATE HFA 108 (90 BASE) MCG/ACT IN AERS: 2.0000 | INHALATION_SPRAY | RESPIRATORY_TRACT | 0 refills | Status: DC | PRN

## 2021-07-16 NOTE — Discharge Instructions (Signed)
Take the Doxycycline twice daily for 10 days with food for treatment of your ear infection.  Take an over-the-counter probiotic 1 hour after each dose of antibiotic to prevent diarrhea.  Use over-the-counter Tylenol and ibuprofen as needed for pain or fever.  Place a hot water bottle, or heating pad, underneath your pillowcase at night to help dilate up your ear and aid in pain relief as well as resolution of the infection.  Use the Atrovent nasal spray, 2 squirts in each nostril every 6 hours, as needed for runny nose and postnasal drip.  Use the Tessalon Perles every 8 hours during the day.  Take them with a small sip of water.  They may give you some numbness to the base of your tongue or a metallic taste in your mouth, this is normal.  Use the Promethazine DM cough syrup at bedtime for cough and congestion.  It will make you drowsy so do not take it during the day.  Return for reevaluation or see your primary care provider for any new or worsening symptoms.

## 2021-07-16 NOTE — ED Provider Notes (Signed)
[facility]    CSN: [id number] Arrival date & time: 07/16/21  1024      History   Chief Complaint Chief Complaint  Patient presents with   Cough    HPI Sandra Berger is a 30 y.o. female.   HPI  30 year old female here for evaluation of cough.  Patient ports that she has been experiencing a cough for last couple of days that increases at night and improves during the day.  This has been associated with runny nose and nasal congestion, sore throat, ear pressure, and wheezing.  She does have a history of asthma and diabetes.  She denies fever, shortness of breath, or GI symptoms.  Her daughter does have similar symptoms to hers.  Past Medical History:  Diagnosis Date   Asthma    Fracture, femur (HCC)     There are no problems to display for this patient.   Past Surgical History:  Procedure Laterality Date   HIP SURGERY Left     OB History   No obstetric history on file.      Home Medications    Prior to Admission medications   Medication Sig Start Date End Date Taking? Authorizing Provider  doxycycline (VIBRAMYCIN) 100 MG capsule Take 1 capsule (100 mg total) by mouth 2 (two) times daily. 07/16/21  Yes Becky Augusta, NP  ipratropium (ATROVENT) 0.06 % nasal spray Place 2 sprays into both nostrils 4 (four) times daily. 07/16/21  Yes Becky Augusta, NP  promethazine-dextromethorphan (PROMETHAZINE-DM) 6.25-15 MG/5ML syrup Take 5 mLs by mouth 4 (four) times daily as needed. 07/16/21  Yes Becky Augusta, NP  albuterol (VENTOLIN HFA) 108 (90 Base) MCG/ACT inhaler Inhale 2 puffs into the lungs every 4 (four) hours as needed for wheezing or shortness of breath. 07/16/21   Becky Augusta, NP  benzonatate (TESSALON) 100 MG capsule Take 2 capsules (200 mg total) by mouth 3 (three) times daily as needed for cough. 07/16/21   Becky Augusta, NP  fluticasone (FLONASE) 50 MCG/ACT nasal spray Place 1 spray into both nostrils daily. 10/25/18 08/07/20  Candis Schatz, PA-C     Family History Family History  Problem Relation Age of Onset   Heart failure Mother    Irritable bowel syndrome Mother    Hypertension Mother    Anxiety disorder Mother     Social History Social History   Tobacco Use   Smoking status: Every Day    Types: Cigars   Smokeless tobacco: Never  Vaping Use   Vaping Use: Never used  Substance Use Topics   Alcohol use: Yes    Comment: occasionally   Drug use: Not Currently     Allergies   Penicillins and Percocet [oxycodone-acetaminophen]   Review of Systems Review of Systems  Constitutional:  Negative for activity change, appetite change and fever.  HENT:  Positive for congestion, ear pain, rhinorrhea and sore throat.   Respiratory:  Positive for cough and wheezing. Negative for shortness of breath.   Gastrointestinal:  Negative for diarrhea, nausea and vomiting.  Skin:  Negative for rash.  Hematological: Negative.   Psychiatric/Behavioral: Negative.      Physical Exam Triage Vital Signs ED Triage Vitals  Enc Vitals Group     BP 07/16/21 1208 (!) 155/107     Pulse Rate 07/16/21 1208 97     Resp 07/16/21 1208 18     Temp 07/16/21 1208 98.7 F (37.1 C)     Temp Source 07/16/21 1208 Oral     SpO2  07/16/21 1208 99 %     Weight 07/16/21 1207 (!) 315 lb (142.9 kg)     Height 07/16/21 1207 5\' 2"  (1.575 m)     Head Circumference --      Peak Flow --      Pain Score 07/16/21 1207 0     Pain Loc --      Pain Edu? --      Excl. in GC? --    No data found.  Updated Vital Signs BP (!) 155/107 (BP Location: Right Arm)    Pulse 97    Temp 98.7 F (37.1 C) (Oral)    Resp 18    Ht 5\' 2"  (1.575 m)    Wt (!) 315 lb (142.9 kg)    SpO2 99%    BMI 57.61 kg/m   Visual Acuity Right Eye Distance:   Left Eye Distance:   Bilateral Distance:    Right Eye Near:   Left Eye Near:    Bilateral Near:     Physical Exam Vitals and nursing note reviewed.  Constitutional:      Appearance: Normal appearance. She is obese. She  is not ill-appearing.  HENT:     Head: Normocephalic and atraumatic.     Right Ear: Ear canal and external ear normal. There is no impacted cerumen.     Left Ear: Tympanic membrane, ear canal and external ear normal. There is no impacted cerumen.     Ears:     Comments: Right tympanic membrane is erythematous and injected.    Nose: Congestion and rhinorrhea present.     Mouth/Throat:     Mouth: Mucous membranes are moist.     Pharynx: Oropharynx is clear. Posterior oropharyngeal erythema present.  Cardiovascular:     Rate and Rhythm: Normal rate and regular rhythm.     Pulses: Normal pulses.     Heart sounds: Normal heart sounds. No murmur heard.   No gallop.  Pulmonary:     Effort: Pulmonary effort is normal.     Breath sounds: Normal breath sounds. No wheezing, rhonchi or rales.  Musculoskeletal:     Cervical back: Normal range of motion and neck supple.  Lymphadenopathy:     Cervical: No cervical adenopathy.  Skin:    General: Skin is warm and dry.     Capillary Refill: Capillary refill takes less than 2 seconds.     Findings: No erythema or rash.  Neurological:     General: No focal deficit present.     Mental Status: She is alert and oriented to person, place, and time.  Psychiatric:        Mood and Affect: Mood normal.        Behavior: Behavior normal.        Thought Content: Thought content normal.        Judgment: Judgment normal.     UC Treatments / Results  Labs (all labs ordered are listed, but only abnormal results are displayed) Labs Reviewed - No data to display  EKG   Radiology No results found.  Procedures Procedures (including critical care time)  Medications Ordered in UC Medications - No data to display  Initial Impression / Assessment and Plan / UC Course  I have reviewed the triage vital signs and the nursing notes.  Pertinent labs & imaging results that were available during my care of the patient were reviewed by me and considered in my  medical decision making (see chart for details).  Is  a nontoxic-appearing 30 year old female here for evaluation of respiratory complaints as outlined HPI above.  Her most prominent complaint is a cough that increases at nighttime when she lays down.  On physical exam patient has an erythematous and injected right tympanic membrane.  Left tympanic membrane is pearly gray with normal light reflex.  Both external auditory canals are clear.  Nasal mucosa is erythematous and edematous with scant clear nasal discharge in both nares.  Posterior oropharynx is erythematous with mild injection and clear postnasal drip.  No cervical lymphadenopathy appreciated exam.  Cardiopulmonary exam reveals clear lung sounds in all fields.  Patient is requesting refill of her albuterol inhaler which I am happy to do to help with wheezing.  She has a penicillin allergy and has never taken a cephalosporin.  We will treat her otitis media with doxycycline twice daily for 10 days.  We will also give her Atrovent nasal spray to help with nasal congestion, Tessalon Perles and Promethazine DM cough syrup to help with cough and congestion.  Patient denies need for work note.   Final Clinical Impressions(s) / UC Diagnoses   Final diagnoses:  Upper respiratory tract infection, unspecified type  Acute cough  Non-recurrent acute suppurative otitis media of right ear without spontaneous rupture of tympanic membrane     Discharge Instructions      Take the Doxycycline twice daily for 10 days with food for treatment of your ear infection.  Take an over-the-counter probiotic 1 hour after each dose of antibiotic to prevent diarrhea.  Use over-the-counter Tylenol and ibuprofen as needed for pain or fever.  Place a hot water bottle, or heating pad, underneath your pillowcase at night to help dilate up your ear and aid in pain relief as well as resolution of the infection.  Use the Atrovent nasal spray, 2 squirts in each nostril  every 6 hours, as needed for runny nose and postnasal drip.  Use the Tessalon Perles every 8 hours during the day.  Take them with a small sip of water.  They may give you some numbness to the base of your tongue or a metallic taste in your mouth, this is normal.  Use the Promethazine DM cough syrup at bedtime for cough and congestion.  It will make you drowsy so do not take it during the day.  Return for reevaluation or see your primary care provider for any new or worsening symptoms.      ED Prescriptions     Medication Sig Dispense Auth. Provider   albuterol (VENTOLIN HFA) 108 (90 Base) MCG/ACT inhaler Inhale 2 puffs into the lungs every 4 (four) hours as needed for wheezing or shortness of breath. 18 g Becky Augusta, NP   benzonatate (TESSALON) 100 MG capsule Take 2 capsules (200 mg total) by mouth 3 (three) times daily as needed for cough. 21 capsule Becky Augusta, NP   doxycycline (VIBRAMYCIN) 100 MG capsule Take 1 capsule (100 mg total) by mouth 2 (two) times daily. 20 capsule Becky Augusta, NP   promethazine-dextromethorphan (PROMETHAZINE-DM) 6.25-15 MG/5ML syrup Take 5 mLs by mouth 4 (four) times daily as needed. 118 mL Becky Augusta, NP   ipratropium (ATROVENT) 0.06 % nasal spray Place 2 sprays into both nostrils 4 (four) times daily. 15 mL Becky Augusta, NP      PDMP not reviewed this encounter.   Becky Augusta, NP 07/16/21 1246

## 2021-07-16 NOTE — ED Triage Notes (Signed)
Pt here with C/O cough for couple days, worst at night.

## 2021-07-30 ENCOUNTER — Emergency Department
Admission: EM | Admit: 2021-07-30 | Discharge: 2021-07-30 | Discharge status: Against Medical Advice | Payer: Commercial Managed Care - PPO | Source: Home / Self Care

## 2021-08-25 ENCOUNTER — Ambulatory Visit
Admission: EM | Admit: 2021-08-25 | Discharge: 2021-08-25 | Disposition: A | Discharge status: Home / Self Care | Payer: Commercial Managed Care - PPO | Attending: Medical Oncology | Admitting: Medical Oncology

## 2021-08-25 ENCOUNTER — Ambulatory Visit (INDEPENDENT_AMBULATORY_CARE_PROVIDER_SITE_OTHER): Payer: Commercial Managed Care - PPO

## 2021-08-25 ENCOUNTER — Other Ambulatory Visit: Payer: Self-pay

## 2021-08-25 DIAGNOSIS — R509 Fever, unspecified: Secondary | ICD-10-CM | POA: Diagnosis not present

## 2021-08-25 DIAGNOSIS — H9203 Otalgia, bilateral: Secondary | ICD-10-CM | POA: Diagnosis not present

## 2021-08-25 DIAGNOSIS — J3489 Other specified disorders of nose and nasal sinuses: Secondary | ICD-10-CM | POA: Diagnosis not present

## 2021-08-25 DIAGNOSIS — Z20822 Contact with and (suspected) exposure to covid-19: Secondary | ICD-10-CM | POA: Diagnosis not present

## 2021-08-25 DIAGNOSIS — R051 Acute cough: Secondary | ICD-10-CM | POA: Diagnosis not present

## 2021-08-25 DIAGNOSIS — R059 Cough, unspecified: Secondary | ICD-10-CM

## 2021-08-25 DIAGNOSIS — R0981 Nasal congestion: Secondary | ICD-10-CM | POA: Insufficient documentation

## 2021-08-25 DIAGNOSIS — H5789 Other specified disorders of eye and adnexa: Secondary | ICD-10-CM | POA: Insufficient documentation

## 2021-08-25 DIAGNOSIS — R0989 Other specified symptoms and signs involving the circulatory and respiratory systems: Secondary | ICD-10-CM | POA: Insufficient documentation

## 2021-08-25 LAB — CBC WITH DIFFERENTIAL/PLATELET
Abs Immature Granulocytes: 0.05 10*3/uL (ref 0.00–0.07)
Basophils Absolute: 0 10*3/uL (ref 0.0–0.1)
Basophils Relative: 0 %
Eosinophils Absolute: 0 10*3/uL (ref 0.0–0.5)
Eosinophils Relative: 0 %
HCT: 40.6 % (ref 36.0–46.0)
Hemoglobin: 13.4 g/dL (ref 12.0–15.0)
Immature Granulocytes: 1 %
Lymphocytes Relative: 28 %
Lymphs Abs: 2.8 10*3/uL (ref 0.7–4.0)
MCH: 28.7 pg (ref 26.0–34.0)
MCHC: 33 g/dL (ref 30.0–36.0)
MCV: 86.9 fL (ref 80.0–100.0)
Monocytes Absolute: 1.1 10*3/uL — ABNORMAL HIGH (ref 0.1–1.0)
Monocytes Relative: 11 %
Neutro Abs: 6.1 10*3/uL (ref 1.7–7.7)
Neutrophils Relative %: 60 %
Platelets: 260 10*3/uL (ref 150–400)
RBC: 4.67 MIL/uL (ref 3.87–5.11)
RDW: 14.5 % (ref 11.5–15.5)
WBC: 10.1 10*3/uL (ref 4.0–10.5)
nRBC: 0 % (ref 0.0–0.2)

## 2021-08-25 LAB — RESP PANEL BY RT-PCR (FLU A&B, COVID) ARPGX2
Influenza A by PCR: NEGATIVE
Influenza B by PCR: NEGATIVE
SARS Coronavirus 2 by RT PCR: NEGATIVE

## 2021-08-25 MED ORDER — BENZONATATE 100 MG PO CAPS: 100.0000 mg | ORAL_CAPSULE | Freq: Three times a day (TID) | ORAL | 0 refills | Status: DC

## 2021-08-25 MED ORDER — DOXYCYCLINE HYCLATE 100 MG PO CAPS: 100.0000 mg | ORAL_CAPSULE | Freq: Two times a day (BID) | ORAL | 0 refills | Status: DC

## 2021-08-25 NOTE — ED Triage Notes (Signed)
Pt here with C/O right ear pain (last night), eye redness for 4 days, chest congestion for 1 month. Pt was seen here 1 month ago, pt states she is still coughing. Pt was seen on virtual visit on Monday and was given eye drops and Z-pack for throat.

## 2021-08-25 NOTE — ED Provider Notes (Signed)
[facility]    CSN: [id number] Arrival date & time: 08/25/21  7680      History   Chief Complaint Chief Complaint  Patient presents with   Otalgia   Cough    HPI Sandra Berger is a 31 y.o. female.   HPI  Cold Symptoms: Patient reports that they have had symptoms of bilateral ear pain, eye redness, chest congestion and cough. Ear pain and eye redness has been present for 4 days and the cough has been present for the past month days. Symptoms are worsen. They deny SOB, chest pain,or vomiting. They have tried rx eye drops and azithromycin which was started 2 days ago for symptoms. Symptoms have not improved. No known sick contacts.    Past Medical History:  Diagnosis Date   Asthma    Fracture, femur (HCC)     There are no problems to display for this patient.   Past Surgical History:  Procedure Laterality Date   HIP SURGERY Left     OB History   No obstetric history on file.      Home Medications    Prior to Admission medications   Medication Sig Start Date End Date Taking? Authorizing Provider  albuterol (VENTOLIN HFA) 108 (90 Base) MCG/ACT inhaler Inhale 2 puffs into the lungs every 4 (four) hours as needed for wheezing or shortness of breath. 07/16/21  Yes Becky Augusta, NP  azithromycin (ZITHROMAX) 250 MG tablet Take by mouth. 08/23/21  Yes [provider]  ipratropium (ATROVENT) 0.06 % nasal spray Place 2 sprays into both nostrils 4 (four) times daily. 07/16/21  Yes Becky Augusta, NP  benzonatate (TESSALON) 100 MG capsule Take 2 capsules (200 mg total) by mouth 3 (three) times daily as needed for cough. 07/16/21   Becky Augusta, NP  doxycycline (VIBRAMYCIN) 100 MG capsule Take 1 capsule (100 mg total) by mouth 2 (two) times daily. 07/16/21   Becky Augusta, NP  promethazine-dextromethorphan (PROMETHAZINE-DM) 6.25-15 MG/5ML syrup Take 5 mLs by mouth 4 (four) times daily as needed. 07/16/21   Becky Augusta, NP  fluticasone (FLONASE) 50 MCG/ACT nasal  spray Place 1 spray into both nostrils daily. 10/25/18 08/07/20  Candis Schatz, PA-C    Family History Family History  Problem Relation Age of Onset   Heart failure Mother    Irritable bowel syndrome Mother    Hypertension Mother    Anxiety disorder Mother     Social History Social History   Tobacco Use   Smoking status: Every Day    Types: Cigars   Smokeless tobacco: Never  Vaping Use   Vaping Use: Never used  Substance Use Topics   Alcohol use: Yes    Comment: occasionally   Drug use: Not Currently     Allergies   Penicillins and Percocet [oxycodone-acetaminophen]   Review of Systems Review of Systems  As stated above in HPI Physical Exam Triage Vital Signs ED Triage Vitals  Enc Vitals Group     BP 08/25/21 0833 (!) 143/95     Pulse Rate 08/25/21 0833 (!) 120     Resp 08/25/21 0833 (!) 22     Temp 08/25/21 0833 (!) 100.6 F (38.1 C)     Temp Source 08/25/21 0833 Oral     SpO2 08/25/21 0833 96 %     Weight 08/25/21 0828 (!) 315 lb (142.9 kg)     Height 08/25/21 0828 5\' 2"  (1.575 m)     Head Circumference --  Peak Flow --      Pain Score 08/25/21 0830 7     Pain Loc --      Pain Edu? --      Excl. in GC? --    No data found.  Updated Vital Signs BP (!) 143/95 (BP Location: Left Arm)    Pulse (!) 120    Temp (!) 100.6 F (38.1 C) (Oral)    Resp (!) 22    Ht 5\' 2"  (1.575 m)    Wt (!) 315 lb (142.9 kg)    SpO2 96%    BMI 57.61 kg/m   Physical Exam Vitals and nursing note reviewed.  Constitutional:      General: She is not in acute distress.    Appearance: Normal appearance. She is not ill-appearing, toxic-appearing or diaphoretic.  HENT:     Head: Normocephalic and atraumatic.     Right Ear: Tympanic membrane normal.     Left Ear: Tympanic membrane normal.     Nose: Congestion and rhinorrhea present.     Mouth/Throat:     Mouth: Mucous membranes are moist.     Pharynx: Oropharynx is clear. No oropharyngeal exudate or posterior oropharyngeal  erythema.  Eyes:     General:        Right eye: No discharge.        Left eye: No discharge.     Extraocular Movements: Extraocular movements intact.     Conjunctiva/sclera: Conjunctivae normal.     Pupils: Pupils are equal, round, and reactive to light.  Cardiovascular:     Rate and Rhythm: Regular rhythm. Tachycardia present.     Heart sounds: Normal heart sounds.     Comments: HR 110 on exam Pulmonary:     Effort: Pulmonary effort is normal. No respiratory distress.     Breath sounds: Normal breath sounds. No stridor. No wheezing or rhonchi.  Abdominal:     Palpations: Abdomen is soft.  Musculoskeletal:     Cervical back: Normal range of motion and neck supple.  Lymphadenopathy:     Cervical: No cervical adenopathy.  Skin:    General: Skin is warm.     Coloration: Skin is not jaundiced.     Findings: No erythema or rash.  Neurological:     Mental Status: She is alert and oriented to person, place, and time.     UC Treatments / Results  Labs (all labs ordered are listed, but only abnormal results are displayed) Labs Reviewed - No data to display  EKG   Radiology No results found.  Procedures Procedures (including critical care time)  Medications Ordered in UC Medications - No data to display  Initial Impression / Assessment and Plan / UC Course  I have reviewed the triage vital signs and the nursing notes.  Pertinent labs & imaging results that were available during my care of the patient were reviewed by me and considered in my medical decision making (see chart for details).     New. Chest x ray given history, vitals and exam findings. UPDATE: Chest x ray and CBC are reassuring however I am concerned about her x ray when I view it along with vitals. Given this we discussed a watch and wait approach vs adding doxycyline. She elects Doxycyline. Discussed red flag signs and symptoms. Follow up PRN.    Final Clinical Impressions(s) / UC Diagnoses   Final  diagnoses:  None   Discharge Instructions   None    ED Prescriptions  None    PDMP not reviewed this encounter.   Rushie ChestnutCovington, Gelila Well M, [place] JerseyPA-C 08/25/21 1049

## 2022-07-26 ENCOUNTER — Ambulatory Visit
Admission: EM | Admit: 2022-07-26 | Discharge: 2022-07-26 | Disposition: A | Discharge status: Home / Self Care | Payer: BC Managed Care – PPO | Attending: Internal Medicine | Admitting: Internal Medicine

## 2022-07-26 ENCOUNTER — Ambulatory Visit (INDEPENDENT_AMBULATORY_CARE_PROVIDER_SITE_OTHER): Payer: BC Managed Care – PPO

## 2022-07-26 DIAGNOSIS — A084 Viral intestinal infection, unspecified: Secondary | ICD-10-CM

## 2022-07-26 DIAGNOSIS — J111 Influenza due to unidentified influenza virus with other respiratory manifestations: Secondary | ICD-10-CM | POA: Insufficient documentation

## 2022-07-26 DIAGNOSIS — Z1152 Encounter for screening for COVID-19: Secondary | ICD-10-CM | POA: Diagnosis not present

## 2022-07-26 LAB — CBC WITH DIFFERENTIAL/PLATELET
Abs Immature Granulocytes: 0.01 10*3/uL (ref 0.00–0.07)
Basophils Absolute: 0 10*3/uL (ref 0.0–0.1)
Basophils Relative: 0 %
Eosinophils Absolute: 0 10*3/uL (ref 0.0–0.5)
Eosinophils Relative: 0 %
HCT: 47.2 % — ABNORMAL HIGH (ref 36.0–46.0)
Hemoglobin: 15.7 g/dL — ABNORMAL HIGH (ref 12.0–15.0)
Immature Granulocytes: 0 %
Lymphocytes Relative: 56 %
Lymphs Abs: 2.5 10*3/uL (ref 0.7–4.0)
MCH: 29 pg (ref 26.0–34.0)
MCHC: 33.3 g/dL (ref 30.0–36.0)
MCV: 87.2 fL (ref 80.0–100.0)
Monocytes Absolute: 0.6 10*3/uL (ref 0.1–1.0)
Monocytes Relative: 14 %
Neutro Abs: 1.4 10*3/uL — ABNORMAL LOW (ref 1.7–7.7)
Neutrophils Relative %: 30 %
Platelets: 209 10*3/uL (ref 150–400)
RBC: 5.41 MIL/uL — ABNORMAL HIGH (ref 3.87–5.11)
RDW: 14 % (ref 11.5–15.5)
WBC: 4.5 10*3/uL (ref 4.0–10.5)
nRBC: 0 % (ref 0.0–0.2)

## 2022-07-26 LAB — COMPREHENSIVE METABOLIC PANEL
ALT: 17 U/L (ref 0–44)
AST: 25 U/L (ref 15–41)
Albumin: 3.4 g/dL — ABNORMAL LOW (ref 3.5–5.0)
Alkaline Phosphatase: 82 U/L (ref 38–126)
Anion gap: 9 (ref 5–15)
BUN: 11 mg/dL (ref 6–20)
CO2: 25 mmol/L (ref 22–32)
Calcium: 8.4 mg/dL — ABNORMAL LOW (ref 8.9–10.3)
Chloride: 103 mmol/L (ref 98–111)
Creatinine, Ser: 0.98 mg/dL (ref 0.44–1.00)
GFR, Estimated: >60 mL/min (ref >60)
Glucose, Bld: 99 mg/dL (ref 70–99)
Potassium: 3.8 mmol/L (ref 3.5–5.1)
Sodium: 137 mmol/L (ref 135–145)
Total Bilirubin: 0.3 mg/dL (ref 0.3–1.2)
Total Protein: 7.5 g/dL (ref 6.5–8.1)

## 2022-07-26 LAB — URINALYSIS, ROUTINE W REFLEX MICROSCOPIC
Glucose, UA: NEGATIVE mg/dL
Ketones, ur: 15 mg/dL — AB
Leukocytes,Ua: NEGATIVE
Nitrite: NEGATIVE
Protein, ur: >300 mg/dL — AB
Specific Gravity, Urine: >1.03 — ABNORMAL HIGH (ref 1.005–1.030)
pH: 6 (ref 5.0–8.0)

## 2022-07-26 LAB — SARS CORONAVIRUS 2 BY RT PCR: SARS Coronavirus 2 by RT PCR: NEGATIVE

## 2022-07-26 LAB — URINALYSIS, MICROSCOPIC (REFLEX)

## 2022-07-26 MED ORDER — ONDANSETRON 8 MG PO TBDP
8.0000 mg | ORAL_TABLET | Freq: Once | ORAL | Status: AC
  Administered 2022-07-26: 8 mg via ORAL

## 2022-07-26 MED ORDER — ACETAMINOPHEN 500 MG PO TABS: 500.0000 mg | ORAL_TABLET | Freq: Once | ORAL | Status: DC

## 2022-07-26 MED ORDER — ONDANSETRON HCL 4 MG PO TABS: 4.0000 mg | ORAL_TABLET | Freq: Four times a day (QID) | ORAL | 0 refills | Status: AC

## 2022-07-26 NOTE — Discharge Instructions (Addendum)
Your blood work does not show you have a bacterial infection. Your protein and calcium are a little low, so when you are better, eat more protein and calcium rich foods. The abdominal and chest xrays are normal.  We do not have CT today, so if your abdominal pain gets worse, please go to the ER.

## 2022-07-26 NOTE — ED Triage Notes (Signed)
Pt c/o cough, vomiting, abdominal pain, temp of 102, body aches x5days  Pt states that she last vomited last night.   Pt asks for all the medication she had last year for a Double ear infection and URI.

## 2022-07-26 NOTE — ED Provider Notes (Signed)
[facility]    CSN: [id number] Arrival date & time: 07/26/22  [id number]      History   Chief Complaint Chief Complaint  Patient presents with   Abdominal Pain   Generalized Body Aches   Vomiting    HPI Sandra Berger is a 31 y.o. female who developed chills, cough, body aches 4 days ago and has been in bed laying around. She got very hot yesterday, and vomited x 2. Seems getting hot provoked the nausea and vomiting. She is a smoker. Her appetite has been down, and been very fatigued. Has generalized abdominal pain, but feels worse on epigastric and LUQ area. Denies constipation or diarrhea. She feels weak and light headed.     Past Medical History:  Diagnosis Date   Asthma    Fracture, femur ([place])     There are no problems to display for this patient.   Past Surgical History:  Procedure Laterality Date   HIP SURGERY Left     OB History   No obstetric history on file.      Home Medications    Prior to Admission medications   Medication Sig Start Date End Date Taking? Authorizing Provider  ondansetron (ZOFRAN) 4 MG tablet Take 1 tablet (4 mg total) by mouth every 6 (six) hours. 07/26/22  Yes Rodriguez-Southworth, Sunday Spillers, PA-C  fluticasone (FLONASE) 50 MCG/ACT nasal spray Place 1 spray into both nostrils daily. 10/25/18 08/07/20  Luvenia Redden, PA-C    Family History Family History  Problem Relation Age of Onset   Heart failure Mother    Irritable bowel syndrome Mother    Hypertension Mother    Anxiety disorder Mother     Social History Social History   Tobacco Use   Smoking status: Every Day    Types: Cigars   Smokeless tobacco: Never  Vaping Use   Vaping Use: Never used  Substance Use Topics   Alcohol use: Yes    Comment: occasionally   Drug use: Not Currently     Allergies   Penicillins and Percocet [oxycodone-acetaminophen]   Review of Systems Review of Systems  Constitutional:  Positive for activity change, chills, fatigue and  fever. Negative for appetite change.  HENT:  Positive for congestion, ear pain and postnasal drip. Negative for ear discharge, rhinorrhea and sore throat.   Eyes:  Negative for discharge.  Respiratory:  Positive for cough.   Gastrointestinal:  Positive for abdominal pain, nausea and vomiting. Negative for constipation and diarrhea.  Genitourinary:  Negative for dysuria and frequency.  Musculoskeletal:  Positive for myalgias.  Neurological:  Positive for headaches.     Physical Exam Triage Vital Signs ED Triage Vitals  Enc Vitals Group     BP 07/26/22 1059 (!) 114/96     Pulse Rate 07/26/22 1059 (!) 107     Resp 07/26/22 1059 20     Temp 07/26/22 1059 (!) 100.5 F (38.1 C)     Temp Source 07/26/22 1059 Oral     SpO2 07/26/22 1059 95 %     Weight 07/26/22 1057 263 lb (119.3 kg)     Height 07/26/22 1057 5\' 2"  (1.575 m)     Head Circumference --      Peak Flow --      Pain Score 07/26/22 1056 10     Pain Loc --      Pain Edu? --      Excl. in GC? --    Orthostatic VS for the past 24  hrs:  BP- Lying Pulse- Lying BP- Sitting Pulse- Sitting BP- Standing at 0 minutes Pulse- Standing at 0 minutes  07/26/22 1208 114/80 106 129/52 106 129/52 132    Updated Vital Signs BP (!) 114/96 (BP Location: Left Arm)   Pulse (!) 107   Temp (!) 100.5 F (38.1 C) (Oral)   Resp 20   Ht 5\' 2"  (1.575 m)   Wt 263 lb (119.3 kg)   LMP 07/25/2022 Comment: denies preg, signed waiver  SpO2 95%   BMI 48.10 kg/m   Visual Acuity Right Eye Distance:   Left Eye Distance:   Bilateral Distance:    Right Eye Near:   Left Eye Near:    Bilateral Near:      Physical Exam Vitals signs and nursing note reviewed.  Constitutional:      General: She is not in acute distress.    Appearance: Normal appearance. She is not ill-appearing, toxic-appearing or diaphoretic.  HENT:     Head: Normocephalic.     Right Ear: Tympanic membrane dull and gray, but ear canal and external ear normal.     Left Ear:  Tympanic membrane, ear canal and external ear normal.     Nose: Nose normal.     Mouth/Throat:     Mouth: Mucous membranes are moist.  Eyes:     General: No scleral icterus.       Right eye: No discharge.        Left eye: No discharge.     Conjunctiva/sclera: Conjunctivae normal.  Neck:     Musculoskeletal: Neck supple. No neck rigidity.  Cardiovascular:     Rate and Rhythm: Normal rate and regular rhythm.     Heart sounds: No murmur.  Pulmonary:     Effort: Pulmonary effort is normal.     Breath sounds: Normal breath sounds.  Abdominal:     General: Bowel sounds are normal. There is no distension.     Palpations: Abdomen is soft. There is no mass.     Tenderness: There is lower abdominal tenderness on RLQ and LUQ, with mild guarding.  There is no rebound.  Musculoskeletal: Normal range of motion.  Lymphadenopathy:     Cervical: No cervical adenopathy.  Skin:    General: Skin is warm and dry.     Coloration: Skin is not jaundiced.     Findings: No rash.  Neurological:     Mental Status: She is alert and oriented to person, place, and time.     Gait: Gait normal.  Psychiatric:        Mood and Affect: Mood normal.        Behavior: Behavior normal.        Thought Content: Thought content normal.        Judgment: Judgment normal.    UC Treatments / Results  Labs (all labs ordered are listed, but only abnormal results are displayed) Labs Reviewed  COMPREHENSIVE METABOLIC PANEL - Abnormal; Notable for the following components:      Result Value   Calcium 8.4 (*)    Albumin 3.4 (*)    All other components within normal limits  CBC WITH DIFFERENTIAL/PLATELET - Abnormal; Notable for the following components:   RBC 5.41 (*)    Hemoglobin 15.7 (*)    HCT 47.2 (*)    Neutro Abs 1.4 (*)    All other components within normal limits  URINALYSIS, ROUTINE W REFLEX MICROSCOPIC - Abnormal; Notable for the following components:   Color,  Urine AMBER (*)    Specific Gravity, Urine  >1.030 (*)    Hgb urine dipstick LARGE (*)    Bilirubin Urine SMALL (*)    Ketones, ur 15 (*)    Protein, ur >300 (*)    All other components within normal limits  URINALYSIS, MICROSCOPIC (REFLEX) - Abnormal; Notable for the following components:   Bacteria, UA FEW (*)    All other components within normal limits  SARS CORONAVIRUS 2 BY RT PCR  Covid test is negative  EKG   Radiology DG Abd 2 Views  Result Date: 07/26/2022 CLINICAL DATA:  Lower abdominal pain with vomiting, fever, and body aches for the past 5 days. EXAM: ABDOMEN - 2 VIEW COMPARISON:  None Available. FINDINGS: The bowel gas pattern is normal. There is no evidence of free air. No radio-opaque calculi or other significant radiographic abnormality is seen. IMPRESSION: Negative. Electronically Signed   By: Titus Dubin M.D.   On: 07/26/2022 12:54   DG Chest 2 View  Result Date: 07/26/2022 CLINICAL DATA:  Cough EXAM: CHEST - 2 VIEW COMPARISON:  08/25/2021 FINDINGS: The heart size and mediastinal contours are within normal limits. No focal airspace consolidation, pleural effusion, or pneumothorax. The visualized skeletal structures are unremarkable. IMPRESSION: No active cardiopulmonary disease. Electronically Signed   By: Davina Poke D.O.   On: 07/26/2022 11:31    Procedures Procedures (including critical care time)  Medications Ordered in UC Medications  ondansetron (ZOFRAN-ODT) disintegrating tablet 8 mg (8 mg Oral Given 07/26/22 1207)    Initial Impression / Assessment and Plan / UC Course  I have reviewed the triage vital signs and the nursing notes. She was given Zofran 8 mg ODT and was able to drink 16 ox of water and keep it down. She declined Tylenol for fever since she did not eat today.  Pertinent labs & imaging results that were available during my care of the patient were reviewed by me and considered in my medical decision making (see chart for details).  Gastroenteritis Flu like illness  I  placed her on Zofran and off work today and tomorrow. Needs to follow bland diet.    Clinical Course as of 07/26/22 1334  Tue Jul 26, 2022  1307 NEUT#(!): 1.4 [SR]    Clinical Course User Index [SR] Rodriguez-Southworth, Sunday Spillers, [place]     Final Clinical Impressions(s) / UC Diagnoses   Final diagnoses:  Influenza-like illness  Viral gastroenteritis     Discharge Instructions      Your blood work does not show you have a bacterial infection. Your protein and calcium are a little low, so when you are better, eat more protein and calcium rich foods. The abdominal and chest xrays are normal.  We do not have CT today, so if your abdominal pain gets worse, please go to the ER.      ED Prescriptions     Medication Sig Dispense Auth. Provider   ondansetron (ZOFRAN) 4 MG tablet Take 1 tablet (4 mg total) by mouth every 6 (six) hours. 12 tablet Rodriguez-Southworth, Sunday Spillers, PA-C      PDMP not reviewed this encounter.   Shelby Mattocks, PA-C 07/26/22 1334

## 2023-01-19 DIAGNOSIS — I10 Essential (primary) hypertension: Secondary | ICD-10-CM | POA: Diagnosis not present

## 2023-01-19 DIAGNOSIS — E119 Type 2 diabetes mellitus without complications: Secondary | ICD-10-CM | POA: Diagnosis not present

## 2023-01-19 DIAGNOSIS — R070 Pain in throat: Secondary | ICD-10-CM | POA: Diagnosis not present

## 2023-01-19 DIAGNOSIS — Z880 Allergy status to penicillin: Secondary | ICD-10-CM | POA: Diagnosis not present

## 2023-01-19 DIAGNOSIS — Z885 Allergy status to narcotic agent status: Secondary | ICD-10-CM | POA: Diagnosis not present

## 2023-01-19 DIAGNOSIS — J039 Acute tonsillitis, unspecified: Secondary | ICD-10-CM | POA: Diagnosis not present

## 2023-01-19 DIAGNOSIS — J029 Acute pharyngitis, unspecified: Secondary | ICD-10-CM | POA: Diagnosis not present

## 2023-04-22 DIAGNOSIS — Z880 Allergy status to penicillin: Secondary | ICD-10-CM | POA: Diagnosis not present

## 2023-04-22 DIAGNOSIS — E119 Type 2 diabetes mellitus without complications: Secondary | ICD-10-CM | POA: Diagnosis not present

## 2023-04-22 DIAGNOSIS — E669 Obesity, unspecified: Secondary | ICD-10-CM | POA: Diagnosis not present

## 2023-04-22 DIAGNOSIS — J45909 Unspecified asthma, uncomplicated: Secondary | ICD-10-CM | POA: Diagnosis not present

## 2023-04-22 DIAGNOSIS — H6091 Unspecified otitis externa, right ear: Secondary | ICD-10-CM | POA: Diagnosis not present

## 2023-04-22 DIAGNOSIS — F17210 Nicotine dependence, cigarettes, uncomplicated: Secondary | ICD-10-CM | POA: Diagnosis not present

## 2023-04-22 DIAGNOSIS — H9203 Otalgia, bilateral: Secondary | ICD-10-CM | POA: Diagnosis not present

## 2023-04-22 DIAGNOSIS — H6092 Unspecified otitis externa, left ear: Secondary | ICD-10-CM | POA: Diagnosis not present

## 2023-04-22 DIAGNOSIS — Z885 Allergy status to narcotic agent status: Secondary | ICD-10-CM | POA: Diagnosis not present

## 2023-04-22 DIAGNOSIS — J0100 Acute maxillary sinusitis, unspecified: Secondary | ICD-10-CM | POA: Diagnosis not present

## 2023-04-22 DIAGNOSIS — I10 Essential (primary) hypertension: Secondary | ICD-10-CM | POA: Diagnosis not present

## 2023-04-22 DIAGNOSIS — J069 Acute upper respiratory infection, unspecified: Secondary | ICD-10-CM | POA: Diagnosis not present

## 2023-05-14 DIAGNOSIS — F17210 Nicotine dependence, cigarettes, uncomplicated: Secondary | ICD-10-CM | POA: Diagnosis not present

## 2023-05-14 DIAGNOSIS — L299 Pruritus, unspecified: Secondary | ICD-10-CM | POA: Diagnosis not present

## 2023-05-14 DIAGNOSIS — F1290 Cannabis use, unspecified, uncomplicated: Secondary | ICD-10-CM | POA: Diagnosis not present

## 2023-05-14 DIAGNOSIS — B3731 Acute candidiasis of vulva and vagina: Secondary | ICD-10-CM | POA: Diagnosis not present

## 2023-05-14 DIAGNOSIS — Z885 Allergy status to narcotic agent status: Secondary | ICD-10-CM | POA: Diagnosis not present

## 2023-05-14 DIAGNOSIS — Z880 Allergy status to penicillin: Secondary | ICD-10-CM | POA: Diagnosis not present

## 2023-05-14 DIAGNOSIS — E669 Obesity, unspecified: Secondary | ICD-10-CM | POA: Diagnosis not present

## 2023-05-14 DIAGNOSIS — Z79899 Other long term (current) drug therapy: Secondary | ICD-10-CM | POA: Diagnosis not present

## 2023-05-14 DIAGNOSIS — I10 Essential (primary) hypertension: Secondary | ICD-10-CM | POA: Diagnosis not present

## 2023-05-14 DIAGNOSIS — E119 Type 2 diabetes mellitus without complications: Secondary | ICD-10-CM | POA: Diagnosis not present

## 2023-06-07 DIAGNOSIS — R519 Headache, unspecified: Secondary | ICD-10-CM | POA: Diagnosis not present

## 2023-06-08 DIAGNOSIS — R519 Headache, unspecified: Secondary | ICD-10-CM | POA: Diagnosis not present

## 2023-07-21 DIAGNOSIS — Z79899 Other long term (current) drug therapy: Secondary | ICD-10-CM | POA: Diagnosis not present

## 2023-07-21 DIAGNOSIS — Z880 Allergy status to penicillin: Secondary | ICD-10-CM | POA: Diagnosis not present

## 2023-07-21 DIAGNOSIS — E119 Type 2 diabetes mellitus without complications: Secondary | ICD-10-CM | POA: Diagnosis not present

## 2023-07-21 DIAGNOSIS — X509XXA Other and unspecified overexertion or strenuous movements or postures, initial encounter: Secondary | ICD-10-CM | POA: Diagnosis not present

## 2023-07-21 DIAGNOSIS — S39012A Strain of muscle, fascia and tendon of lower back, initial encounter: Secondary | ICD-10-CM | POA: Diagnosis not present

## 2023-07-21 DIAGNOSIS — Z885 Allergy status to narcotic agent status: Secondary | ICD-10-CM | POA: Diagnosis not present

## 2023-07-21 DIAGNOSIS — R051 Acute cough: Secondary | ICD-10-CM | POA: Diagnosis not present

## 2023-07-21 DIAGNOSIS — I10 Essential (primary) hypertension: Secondary | ICD-10-CM | POA: Diagnosis not present

## 2023-07-21 DIAGNOSIS — E669 Obesity, unspecified: Secondary | ICD-10-CM | POA: Diagnosis not present

## 2023-07-23 DIAGNOSIS — Z885 Allergy status to narcotic agent status: Secondary | ICD-10-CM | POA: Diagnosis not present

## 2023-07-23 DIAGNOSIS — Z79899 Other long term (current) drug therapy: Secondary | ICD-10-CM | POA: Diagnosis not present

## 2023-07-23 DIAGNOSIS — Z5941 Food insecurity: Secondary | ICD-10-CM | POA: Diagnosis not present

## 2023-07-23 DIAGNOSIS — Z880 Allergy status to penicillin: Secondary | ICD-10-CM | POA: Diagnosis not present

## 2023-07-23 DIAGNOSIS — Z87891 Personal history of nicotine dependence: Secondary | ICD-10-CM | POA: Diagnosis not present

## 2023-07-23 DIAGNOSIS — I10 Essential (primary) hypertension: Secondary | ICD-10-CM | POA: Diagnosis not present

## 2023-07-23 DIAGNOSIS — M549 Dorsalgia, unspecified: Secondary | ICD-10-CM | POA: Diagnosis not present

## 2023-07-23 DIAGNOSIS — H9201 Otalgia, right ear: Secondary | ICD-10-CM | POA: Diagnosis not present

## 2023-07-23 DIAGNOSIS — E119 Type 2 diabetes mellitus without complications: Secondary | ICD-10-CM | POA: Diagnosis not present

## 2023-08-03 DIAGNOSIS — M549 Dorsalgia, unspecified: Secondary | ICD-10-CM | POA: Diagnosis not present

## 2023-08-03 DIAGNOSIS — Z5982 Transportation insecurity: Secondary | ICD-10-CM | POA: Diagnosis not present

## 2023-08-03 DIAGNOSIS — F17210 Nicotine dependence, cigarettes, uncomplicated: Secondary | ICD-10-CM | POA: Diagnosis not present

## 2023-08-03 DIAGNOSIS — Z880 Allergy status to penicillin: Secondary | ICD-10-CM | POA: Diagnosis not present

## 2023-08-03 DIAGNOSIS — I10 Essential (primary) hypertension: Secondary | ICD-10-CM | POA: Diagnosis not present

## 2023-08-03 DIAGNOSIS — J45909 Unspecified asthma, uncomplicated: Secondary | ICD-10-CM | POA: Diagnosis not present

## 2023-08-03 DIAGNOSIS — Z885 Allergy status to narcotic agent status: Secondary | ICD-10-CM | POA: Diagnosis not present

## 2023-08-03 DIAGNOSIS — Z008 Encounter for other general examination: Secondary | ICD-10-CM | POA: Diagnosis not present

## 2023-08-03 DIAGNOSIS — G8929 Other chronic pain: Secondary | ICD-10-CM | POA: Diagnosis not present

## 2023-08-03 DIAGNOSIS — Z823 Family history of stroke: Secondary | ICD-10-CM | POA: Diagnosis not present

## 2023-08-03 DIAGNOSIS — Z5948 Other specified lack of adequate food: Secondary | ICD-10-CM | POA: Diagnosis not present

## 2023-08-03 DIAGNOSIS — E119 Type 2 diabetes mellitus without complications: Secondary | ICD-10-CM | POA: Diagnosis not present

## 2023-08-03 DIAGNOSIS — R32 Unspecified urinary incontinence: Secondary | ICD-10-CM | POA: Diagnosis not present

## 2023-11-06 IMAGING — CR DG CHEST 2V
2 series · 2 of 2 positions shown · non-contrast
Comparison: September 2020
COMPARISON: September 2020

Addendum:
CLINICAL DATA: Cough x 1 month worsening, fever

EXAM:
CHEST - 2 VIEW

[chest pa]
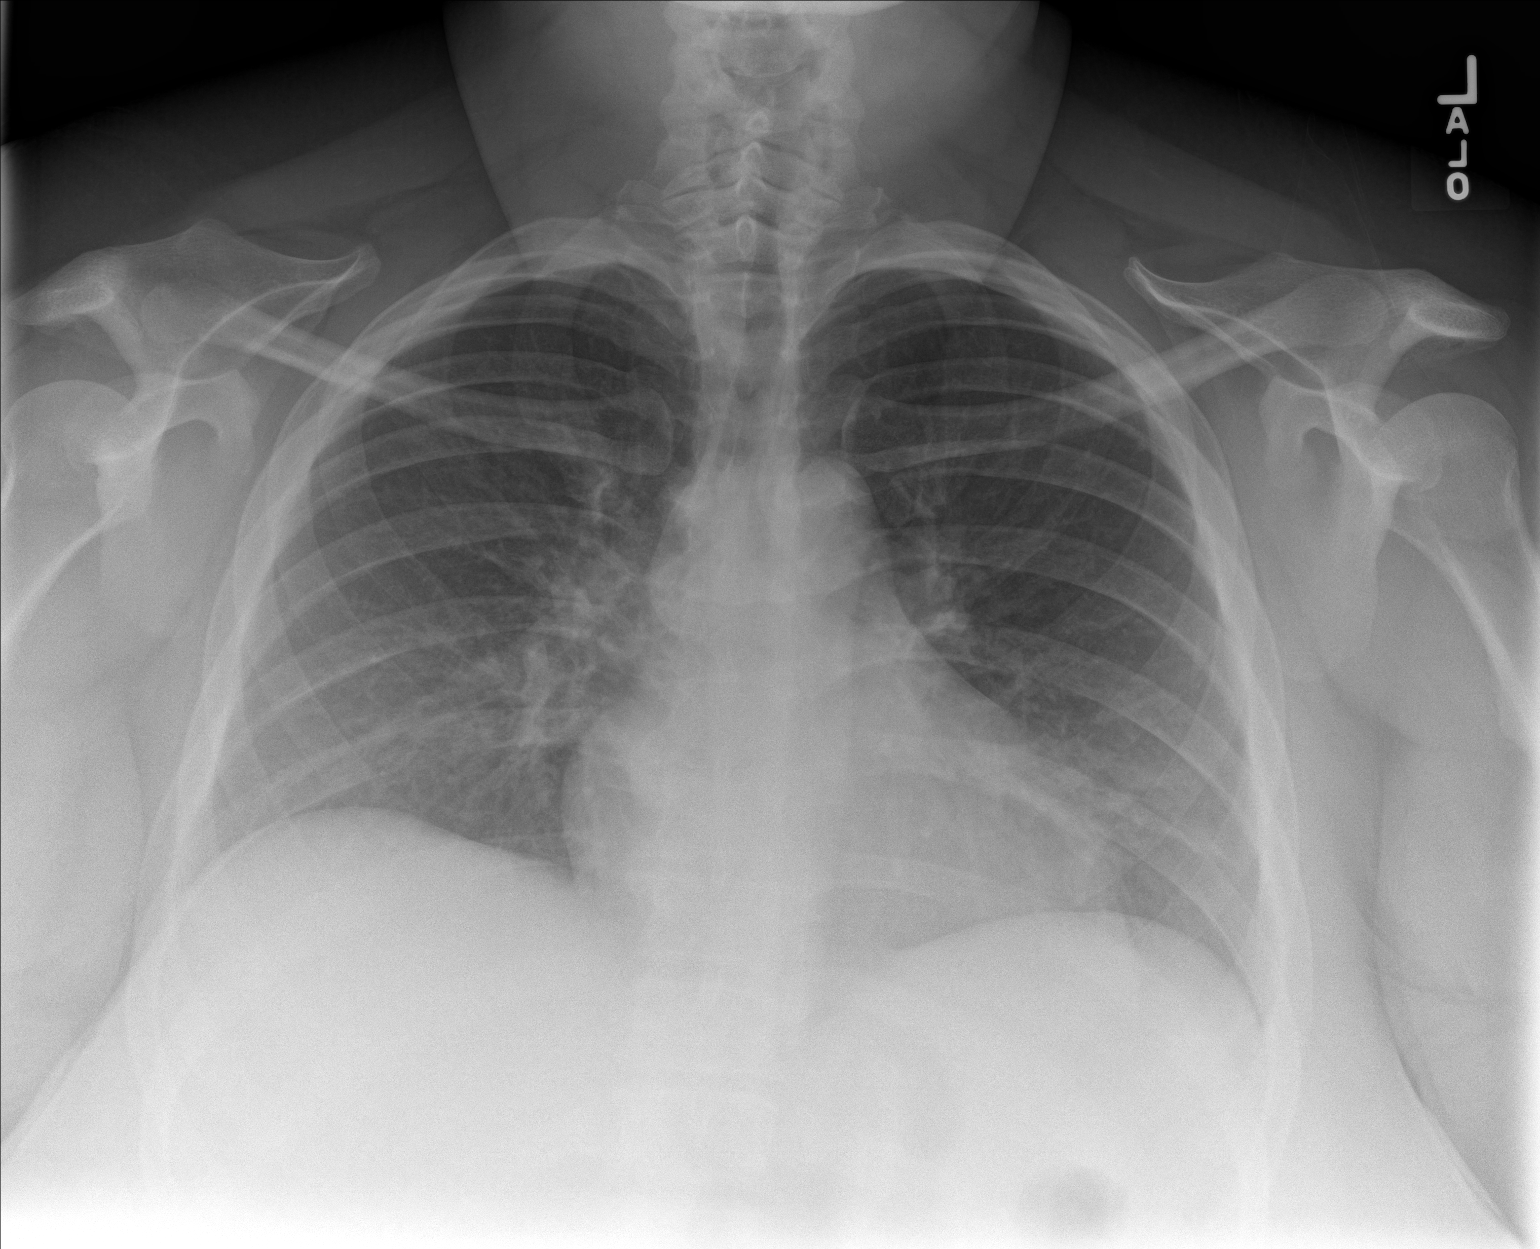

[chest lat]
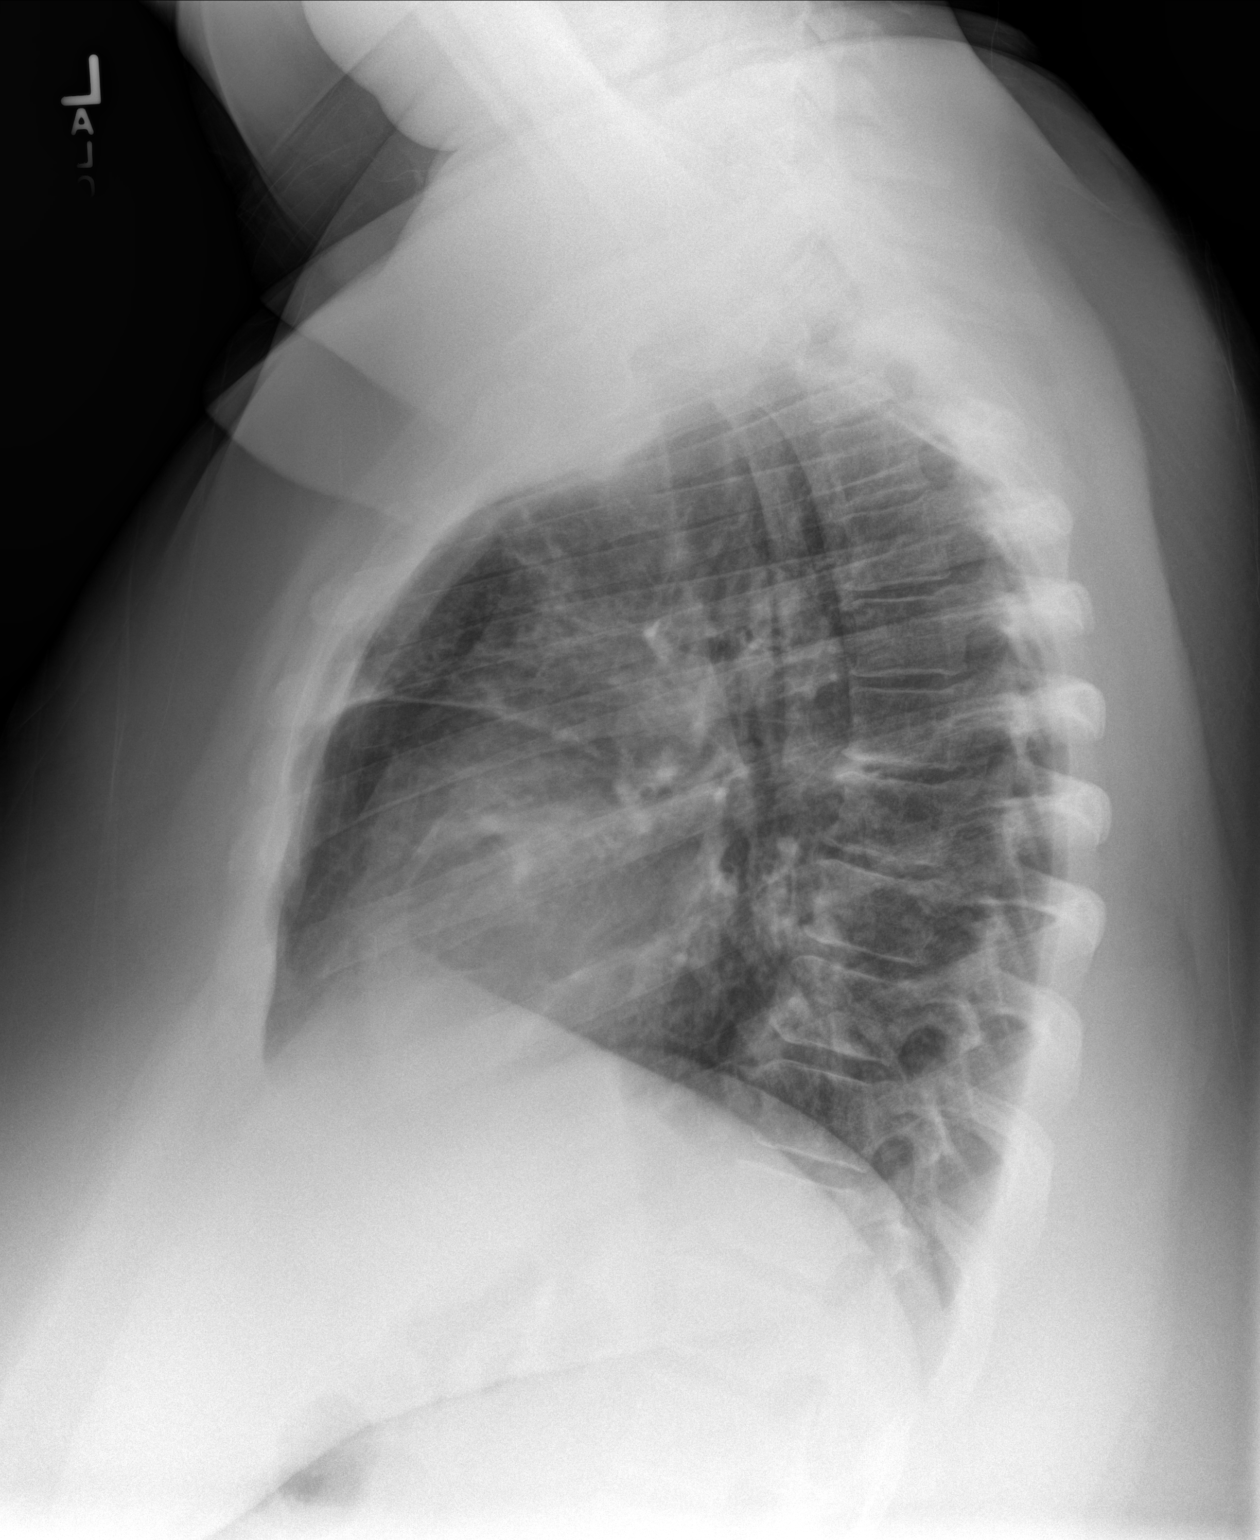

[2 of 2 positions shown; findings below may reference images not displayed]

FINDINGS: The heart size and mediastinal contours are within normal limits.
September 2020. No pleural effusion or pneumothorax. The visualized
skeletal structures are unremarkable.
IMPRESSION: No active cardiopulmonary disease.

ADDENDUM:
Dictation transcription error. No definite new consolidation or
edema rather than September 2020. Questionable patchy increased density
on the lateral view.

*** End of Addendum ***
FINDINGS: The heart size and mediastinal contours are within normal limits.
September 2020. No pleural effusion or pneumothorax. The visualized
skeletal structures are unremarkable.
IMPRESSION: No active cardiopulmonary disease.

## 2023-11-22 ENCOUNTER — Encounter: Payer: Self-pay | Admitting: Family Medicine

## 2023-11-22 ENCOUNTER — Ambulatory Visit (INDEPENDENT_AMBULATORY_CARE_PROVIDER_SITE_OTHER): Admitting: Family Medicine

## 2023-11-22 VITALS — BP 152/101 | HR 95 | Temp 98.5°F | Resp 18 | Ht 62.0 in | Wt 316.0 lb

## 2023-11-22 DIAGNOSIS — I152 Hypertension secondary to endocrine disorders: Secondary | ICD-10-CM

## 2023-11-22 DIAGNOSIS — E1159 Type 2 diabetes mellitus with other circulatory complications: Secondary | ICD-10-CM | POA: Diagnosis not present

## 2023-11-22 DIAGNOSIS — I1A0 Resistant hypertension: Secondary | ICD-10-CM

## 2023-11-22 DIAGNOSIS — R0683 Snoring: Secondary | ICD-10-CM | POA: Insufficient documentation

## 2023-11-22 DIAGNOSIS — I10 Essential (primary) hypertension: Secondary | ICD-10-CM | POA: Diagnosis not present

## 2023-11-22 NOTE — Progress Notes (Signed)
 New Patient Office Visit  Subjective    Patient ID: Sandra Berger, female    DOB: 1990/11/21  Age: 33 y.o. MRN: [id number]  CC:  Chief Complaint  Patient presents with   Establish Care    HPI Sandra Berger presents to establish care Delightful 32-yo woman with HTN, OSA,  depression/anxiety,  penicillin allergy and snoring.   She takes losartan  50 mg twice a day and amlodipine 10 mg daily as needed.  She has not had the amlodipine today.  Blood pressures 152/101. She had a sleep study years ago and thinks it was positive for apnea.  She does snore and wakes up gasping at times.  She has GERD at night.  Sometimes it wakes her up. She thinks her depression and anxiety result from overthinking.  She has a child and her mother and aunt both live with her.  PHQ-9 is 9 and GAD-7 is 5. Positive for marijuana use but not daily, no alcohol. 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  She does get to sit down some. She reports that "I am the heaviest have ever been".  She does not have routine exercise.   Diabetes runs in her family as does hyperlipidemia.  Outpatient Encounter Medications as of 11/22/2023  Medication Sig   amLODipine (NORVASC) 10 MG tablet Take 10 mg by mouth daily.   etonogestrel (NEXPLANON) 68 MG IMPL implant 68 mg by Subdermal route once.   losartan  (COZAAR ) 50 MG tablet Take 50 mg by mouth 2 (two) times daily.   ondansetron  (ZOFRAN ) 4 MG tablet Take 1 tablet (4 mg total) by mouth every 6 (six) hours.   [DISCONTINUED] fluticasone  (FLONASE ) 50 MCG/ACT nasal spray Place 1 spray into both nostrils daily.   No facility-administered encounter medications on file as of 11/22/2023.    Past Medical History:  Diagnosis Date   Asthma    Fracture, femur ([facility])    Hypertension     Past Surgical History:  Procedure Laterality Date   HIP SURGERY Left     Family History  Problem Relation Age of Onset   Heart failure Mother    Irritable bowel syndrome Mother     Hypertension Mother    Anxiety disorder Mother    Stroke Mother    Diabetes Mother    Migraines Father     Social History   Socioeconomic History   Marital status: Single    Spouse name: Not on file   Number of children: Not on file   Years of education: Not on file   Highest education level: Not on file  Occupational History   Not on file  Tobacco Use   Smoking status: Every Day    Types: Cigars    Passive exposure: Current   Smokeless tobacco: Never  Vaping Use   Vaping status: Never Used  Substance and Sexual Activity   Alcohol use: Yes    Comment: occasionally   Drug use: Not Currently   Sexual activity: Not on file  Other Topics Concern   Not on file  Social History Narrative   Not on file   Social Drivers of Health   Financial Resource Strain: Medium Risk (04/07/2023)   Received from [facility]   Overall Financial Resource Strain (CARDIA)    Difficulty of Paying Living Expenses: Somewhat hard  Food Insecurity: Food Insecurity Present (04/07/2023)   Received from [facility]   Hunger Vital Sign    Worried About Running Out of Food in  the Last Year: Sometimes true    Ran Out of Food in the Last Year: Often true  Transportation Needs: No Transportation Needs (04/07/2023)   Received from [facility] - Transportation    Lack of Transportation (Medical): No    Lack of Transportation (Non-Medical): No  Physical Activity: Not on file  Stress: Not on file  Social Connections: Not on file  Intimate Partner Violence: Not on file    ROS      Objective   BP (!) 152/101 (BP Location: Left Arm, Patient Position: Sitting, Cuff Size: Large)   Pulse 95   Temp 98.5 F (36.9 C) (Oral)   Resp 18   Ht 5\' 2"  (1.575 m)   Wt (!) 316 lb (143.3 kg)   LMP 10/26/2023 (Approximate)   SpO2 99%   BMI 57.80 kg/m    Physical Exam Vitals and nursing note reviewed.  Constitutional:      Appearance: Normal appearance.  HENT:     Head: Normocephalic  and atraumatic.  Eyes:     Conjunctiva/sclera: Conjunctivae normal.  Cardiovascular:     Rate and Rhythm: Normal rate and regular rhythm.  Pulmonary:     Effort: Pulmonary effort is normal.     Breath sounds: Normal breath sounds.  Musculoskeletal:     Right lower leg: No edema.     Left lower leg: No edema.  Skin:    General: Skin is warm and dry.  Neurological:     Mental Status: She is alert and oriented to person, place, and time.  Psychiatric:        Mood and Affect: Mood normal.        Behavior: Behavior normal.        Thought Content: Thought content normal.        Judgment: Judgment normal.            The ASCVD Risk score (Arnett DK, et al., 2019) failed to calculate for the following reasons:   The 2019 ASCVD risk score is only valid for ages 33 to 33     Assessment & Plan:  Primary hypertension Assessment & Plan: On losartan  50mg  BID and takes amlodipine 10mg  PRN.  Please take the amlodipine 10 mg everyday.  Please check your BP at home and record.  Checking CMP, CBC, A1c, thyroid panel and lipid panel.  FOLLOW-UP in a week to recheck your BP.  Orders: -     Lipid panel -     CBC with Differential/Platelet -     Comprehensive metabolic panel with GFR -     Hemoglobin A1c -     Microalbumin / creatinine urine ratio -     TSH + free T4  Hypertension associated with diabetes (HCC) Assessment & Plan: On losartan  50mg  BID and takes amlodipine 10mg  PRN.  Please take the amlodipine 10 mg everyday.  Please check your BP at home and record.  Checking CMP, CBC, A1c, thyroid panel and lipid panel.  FOLLOW-UP in a week to recheck your BP.   Resistant hypertension Assessment & Plan: On losartan  50mg  BID and takes amlodipine 10mg  PRN.  Please take the amlodipine 10 mg everyday.  Please check your BP at home and record.  Checking CMP, CBC, A1c, thyroid panel and lipid panel.  FOLLOW-UP in a week to recheck your BP.   Obesity, morbid, BMI 50 or higher  (HCC) Assessment & Plan: Comorbidity is HTN.  Plan to discuss GLP-1.  Snores Assessment & Plan: Would like for her to get a sleep study     Return in about 1 week (around 11/29/2023) for BP recheck.   Dannelle Rhymes K Dorell Gatlin, MD

## 2023-11-22 NOTE — Assessment & Plan Note (Signed)
 Would like for her to get a sleep study

## 2023-11-22 NOTE — Assessment & Plan Note (Signed)
 Comorbidity is HTN.  Plan to discuss GLP-1.

## 2023-11-22 NOTE — Assessment & Plan Note (Signed)
 On losartan 50mg  BID and takes amlodipine 10mg  PRN.  Please take the amlodipine 10 mg everyday.  Please check your BP at home and record.  Checking CMP, CBC, A1c, thyroid panel and lipid panel.  FOLLOW-UP in a week to recheck your BP.

## 2023-11-23 LAB — CBC WITH DIFFERENTIAL/PLATELET
Basophils Absolute: 0 10*3/uL (ref 0.0–0.2)
Basos: 0 %
EOS (ABSOLUTE): 0.2 10*3/uL (ref 0.0–0.4)
Eos: 2 %
Hematocrit: 41.3 % (ref 34.0–46.6)
Hemoglobin: 13.2 g/dL (ref 11.1–15.9)
Immature Grans (Abs): 0 10*3/uL (ref 0.0–0.1)
Immature Granulocytes: 0 %
Lymphocytes Absolute: 5.1 10*3/uL — ABNORMAL HIGH (ref 0.7–3.1)
Lymphs: 54 %
MCH: 28.8 pg (ref 26.6–33.0)
MCHC: 32 g/dL (ref 31.5–35.7)
MCV: 90 fL (ref 79–97)
Monocytes Absolute: 0.7 10*3/uL (ref 0.1–0.9)
Monocytes: 7 %
Neutrophils Absolute: 3.4 10*3/uL (ref 1.4–7.0)
Neutrophils: 37 %
Platelets: 252 10*3/uL (ref 150–450)
RBC: 4.58 x10E6/uL (ref 3.77–5.28)
RDW: 12.7 % (ref 11.7–15.4)
WBC: 9.4 10*3/uL (ref 3.4–10.8)

## 2023-11-23 LAB — MICROALBUMIN / CREATININE URINE RATIO
Creatinine, Urine: 241.5 mg/dL
Microalb/Creat Ratio: 15 mg/g{creat} (ref 0–29)
Microalbumin, Urine: 36.9 ug/mL

## 2023-11-23 LAB — COMPREHENSIVE METABOLIC PANEL WITH GFR
ALT: 18 IU/L (ref 0–32)
AST: 20 IU/L (ref 0–40)
Albumin: 4 g/dL (ref 4.0–5.0)
Alkaline Phosphatase: 95 IU/L (ref 44–121)
BUN/Creatinine Ratio: 12 (ref 9–23)
BUN: 10 mg/dL (ref 6–20)
Bilirubin Total: <0.2 mg/dL (ref 0.0–1.2)
CO2: 21 mmol/L (ref 20–29)
Calcium: 9.2 mg/dL (ref 8.7–10.2)
Chloride: 100 mmol/L (ref 96–106)
Creatinine, Ser: 0.86 mg/dL (ref 0.57–1.00)
Globulin, Total: 2.8 g/dL (ref 1.5–4.5)
Glucose: 89 mg/dL (ref 70–99)
Potassium: 4.4 mmol/L (ref 3.5–5.2)
Sodium: 138 mmol/L (ref 134–144)
Total Protein: 6.8 g/dL (ref 6.0–8.5)
eGFR: 93 mL/min/{1.73_m2} (ref >59)

## 2023-11-23 LAB — HEMOGLOBIN A1C
Est. average glucose Bld gHb Est-mCnc: 123 mg/dL
Hgb A1c MFr Bld: 5.9 % — ABNORMAL HIGH (ref 4.8–5.6)

## 2023-11-23 LAB — LIPID PANEL
Chol/HDL Ratio: 3.4 ratio (ref 0.0–4.4)
Cholesterol, Total: 161 mg/dL (ref 100–199)
HDL: 48 mg/dL (ref >39)
LDL Chol Calc (NIH): 96 mg/dL (ref 0–99)
Triglycerides: 91 mg/dL (ref 0–149)
VLDL Cholesterol Cal: 17 mg/dL (ref 5–40)

## 2023-11-23 LAB — TSH+FREE T4
Free T4: 1.08 ng/dL (ref 0.82–1.77)
TSH: 3.42 u[IU]/mL (ref 0.450–4.500)

## 2023-11-24 ENCOUNTER — Telehealth: Payer: Self-pay

## 2023-11-24 NOTE — Telephone Encounter (Signed)
 Copied from CRM [id number]. Topic: Clinical - Lab/Test Results >> Nov 24, 2023  2:05 PM Crispin Dolphin wrote: Reason for CRM: Patient returned missed call. Call was for labs. Read note as written by provider. Patient understood. No Further questions at this time. Thank You.

## 2023-11-29 ENCOUNTER — Ambulatory Visit (INDEPENDENT_AMBULATORY_CARE_PROVIDER_SITE_OTHER): Admitting: Family Medicine

## 2023-11-29 ENCOUNTER — Encounter: Payer: Self-pay | Admitting: Family Medicine

## 2023-11-29 VITALS — BP 165/105 | HR 95 | Resp 20 | Ht 62.0 in | Wt 313.0 lb

## 2023-11-29 DIAGNOSIS — Z91148 Patient's other noncompliance with medication regimen for other reason: Secondary | ICD-10-CM | POA: Insufficient documentation

## 2023-11-29 DIAGNOSIS — I1A0 Resistant hypertension: Secondary | ICD-10-CM | POA: Diagnosis not present

## 2023-11-29 MED ORDER — LOSARTAN POTASSIUM 100 MG PO TABS: 100.0000 mg | ORAL_TABLET | Freq: Every day | ORAL | 1 refills | Status: DC

## 2023-11-29 NOTE — Assessment & Plan Note (Signed)
 She is not compliant with her blood pressure medication.  She denies chest pains, shortness of breath, palpitations, headache, back pain.   She is on amlodipine 10mg  and Cozaar 100 mg daily.  Encouraged her to take her blood pressure medication daily and follow-up in a week.

## 2023-11-29 NOTE — Progress Notes (Signed)
 Established Patient Office Visit  Subjective   Patient ID: Sandra Berger, female    DOB: 1990/11/15  Age: 33 y.o. MRN: [id number]  Chief Complaint  Patient presents with   Hypertension    Hypertension   Delightful 33 year old female with HTN, morbid obesity, OSA, anxiety/depression.  She has prescriptions for Cozaar 50 twice daily and amlodipine 10 mg. 11/22/2023: A1c 5.9%, normal electrolytes, BUN 10, creatinine 0.86, LFTs normal, Hgb 13.2 platelets 252, total cholesterol 161, Triggs 91, HDL 48 and LDL 96,  urine microalbumin creatinine ratio normal. Blood pressure 165/105.  She states that she has not been taking her blood pressure medicine this week.  She did take amlodipine 10 mg and Cozaar 50 mg about 30 minutes ago.  She denies any side effects from her blood pressure medication.  Lives with her mother and aunt and they both have hypertension.  They have been trying to encourage her to take her medicine more regularly. She uses marijuana but not daily.  Denies any alcohol use.  Smoked black and mild cigars about 5 daily and then switched to vaping.  Now she smokes Black and mild cigars maybe 3 a week.  She does vape multiple times daily.  She has no interest in stopping smoking at this time.    ROS    Objective:     BP (!) 165/105 (BP Location: Left Arm, Patient Position: Sitting, Cuff Size: Large)   Pulse 95   Resp 20   Ht 5\' 2"  (1.575 m)   Wt (!) 313 lb (142 kg)   LMP 10/26/2023 (Approximate)   SpO2 95%   BMI 57.25 kg/m    Physical Exam Vitals and nursing note reviewed.  Constitutional:      Appearance: Normal appearance.  HENT:     Head: Normocephalic and atraumatic.  Eyes:     Conjunctiva/sclera: Conjunctivae normal.  Cardiovascular:     Rate and Rhythm: Normal rate and regular rhythm.  Pulmonary:     Effort: Pulmonary effort is normal.     Breath sounds: Normal breath sounds.  Musculoskeletal:     Right lower leg: No edema.     Left lower leg: No edema.   Skin:    General: Skin is warm and dry.  Neurological:     Mental Status: She is alert and oriented to person, place, and time.  Psychiatric:        Mood and Affect: Mood normal.        Behavior: Behavior normal.        Thought Content: Thought content normal.        Judgment: Judgment normal.          No results found for any visits on 11/29/23.    The ASCVD Risk score (Arnett DK, et al., 2019) failed to calculate for the following reasons:   The 2019 ASCVD risk score is only valid for ages 30 to 83    Assessment & Plan:  Noncompliance with medication regimen Assessment & Plan: Discussed that hypertension is the silent killer.  Encouraged her to take her blood pressure medicine daily.   Resistant hypertension Assessment & Plan: She is not compliant with her blood pressure medication.  She denies chest pains, shortness of breath, palpitations, headache, back pain.   She is on amlodipine 10mg  and Cozaar 100 mg daily.  Encouraged her to take her blood pressure medication daily and follow-up in a week.      Return in about 1 week (around 12/06/2023).  Greene Diodato K Zavien Clubb, MD

## 2023-11-29 NOTE — Assessment & Plan Note (Signed)
 Discussed that hypertension is the silent killer.  Encouraged her to take her blood pressure medicine daily.

## 2023-12-06 ENCOUNTER — Ambulatory Visit (INDEPENDENT_AMBULATORY_CARE_PROVIDER_SITE_OTHER): Admitting: Family Medicine

## 2023-12-06 ENCOUNTER — Encounter: Payer: Self-pay | Admitting: Family Medicine

## 2023-12-06 VITALS — BP 138/89 | HR 93 | Temp 98.1°F | Resp 20 | Ht 62.0 in | Wt 313.0 lb

## 2023-12-06 DIAGNOSIS — I1A0 Resistant hypertension: Secondary | ICD-10-CM | POA: Diagnosis not present

## 2023-12-06 NOTE — Assessment & Plan Note (Addendum)
 Amlodipine 10 mg and losartan  100 mg daily and blood pressure 138/89.  11/22/2023 potassium 4.4 creatinine 0.86.  Please return for fasting lipid profile.  Follow-up in a month and if blood pressure still elevated then we will likely add HCTZ 12.5mg .   Please return for fasting blood work.

## 2023-12-06 NOTE — Progress Notes (Signed)
   Established Patient Office Visit  Subjective   Patient ID: Sandra Berger, female    DOB: 13-May-1991  Age: 34 y.o. MRN: [id number]  Chief Complaint  Patient presents with   Hypertension    Hypertension   delightful 33 year old woman with HTN, borderline diabetes (11/22/2023 A1c 5.9%), OSA, anxiety/depression and penicillin allergy.   She is on losartan  50 mg twice a day and amlodipine 10 mg daily.  She has taken her medicine today.  Her birth control is nexplanon.  She is having no problems taking her blood pressure medication and she reports no side effects. She is not fasting today.     ROS    Objective:     BP 138/89 (BP Location: Left Arm, Patient Position: Sitting, Cuff Size: Large)   Pulse 93   Temp 98.1 F (36.7 C) (Oral)   Resp 20   Ht 5\' 2"  (1.575 m)   Wt (!) 313 lb (142 kg)   LMP 10/26/2023 (Approximate)   SpO2 98%   BMI 57.25 kg/m    Physical Exam Vitals and nursing note reviewed.  Constitutional:      Appearance: Normal appearance.  HENT:     Head: Normocephalic and atraumatic.  Eyes:     Conjunctiva/sclera: Conjunctivae normal.  Cardiovascular:     Rate and Rhythm: Normal rate and regular rhythm.  Pulmonary:     Effort: Pulmonary effort is normal.     Breath sounds: Normal breath sounds.  Musculoskeletal:     Right lower leg: No edema.     Left lower leg: No edema.  Skin:    General: Skin is warm and dry.  Neurological:     Mental Status: She is alert and oriented to person, place, and time.  Psychiatric:        Mood and Affect: Mood normal.        Behavior: Behavior normal.        Thought Content: Thought content normal.        Judgment: Judgment normal.          No results found for any visits on 12/06/23.    The ASCVD Risk score (Arnett DK, et al., 2019) failed to calculate for the following reasons:   The 2019 ASCVD risk score is only valid for ages 83 to 27    Assessment & Plan:  Resistant hypertension Assessment &  Plan: Amlodipine 10 mg and losartan  100 mg daily and blood pressure 138/89.  11/22/2023 potassium 4.4 creatinine 0.86.  Please return for fasting lipid profile.  Follow-up in a month and if blood pressure still elevated then we will likely add HCTZ 12.5mg .   Please return for fasting blood work.  Orders: -     Comprehensive metabolic panel with GFR; Future -     Lipid panel; Future     Return in about 4 weeks (around 01/03/2024).    Sienna Stonehocker K Maxwell Lemen, MD

## 2023-12-11 ENCOUNTER — Ambulatory Visit: Admitting: Family Medicine

## 2023-12-13 ENCOUNTER — Ambulatory Visit (INDEPENDENT_AMBULATORY_CARE_PROVIDER_SITE_OTHER): Admitting: Family Medicine

## 2023-12-13 ENCOUNTER — Encounter: Payer: Self-pay | Admitting: Family Medicine

## 2023-12-13 VITALS — BP 149/78 | HR 97 | Resp 18 | Ht 62.0 in | Wt 313.0 lb

## 2023-12-13 DIAGNOSIS — Z91148 Patient's other noncompliance with medication regimen for other reason: Secondary | ICD-10-CM | POA: Diagnosis not present

## 2023-12-13 DIAGNOSIS — I1A0 Resistant hypertension: Secondary | ICD-10-CM | POA: Diagnosis not present

## 2023-12-13 MED ORDER — HYDROCHLOROTHIAZIDE 12.5 MG PO TABS
12.5000 mg | ORAL_TABLET | Freq: Every day | ORAL | 3 refills | Status: DC
Start: 2023-12-13 — End: 2024-04-10

## 2023-12-13 NOTE — Assessment & Plan Note (Signed)
 She is doing much better about taking her blood pressure medication daily

## 2023-12-13 NOTE — Assessment & Plan Note (Signed)
 She is on amlodipine 10 mg and losartan  100 mg daily.  Adding HCTZ 12.5 mg daily.  Follow-up in a month for comprehensive metabolic and blood pressure check

## 2023-12-13 NOTE — Progress Notes (Signed)
   Established Patient Office Visit  Subjective   Patient ID: Sandra Berger, female    DOB: Oct 08, 1990  Age: 33 y.o. MRN: [id number]  Chief Complaint  Patient presents with   Hypertension    Hypertension  She is feeling well and has no complaints.  She reports that she has consistently been taking losartan  50 mg 2 in the morning and amlodipine 10 mg daily.  Blood pressure today 147/79 and 151/88.  Discussed adding a thiazide diuretic.  She is concerned it will make her void too often.  Discussed that thiazides typically only make you void for the first couple of days.     ROS    Objective:     BP (!) 149/78 (BP Location: Right Arm, Patient Position: Sitting, Cuff Size: Large)   Pulse 97   Resp 18   Ht 5\' 2"  (1.575 m)   Wt (!) 313 lb (142 kg)   LMP 10/26/2023 (Approximate)   SpO2 95%   BMI 57.25 kg/m    Physical Exam Vitals reviewed.  Constitutional:      Appearance: Normal appearance.  HENT:     Head: Normocephalic.  Eyes:     General:        Right eye: No discharge.        Left eye: No discharge.  Cardiovascular:     Rate and Rhythm: Normal rate.  Pulmonary:     Effort: Pulmonary effort is normal.  Neurological:     Mental Status: She is alert and oriented to person, place, and time.  Psychiatric:        Mood and Affect: Mood normal.        Behavior: Behavior normal.        Thought Content: Thought content normal.        Judgment: Judgment normal.          No results found for any visits on 12/13/23.    The ASCVD Risk score (Arnett DK, et al., 2019) failed to calculate for the following reasons:   The 2019 ASCVD risk score is only valid for ages 40 to 26    Assessment & Plan:  Noncompliance with medication regimen Assessment & Plan: She is doing much better about taking her blood pressure medication daily   Resistant hypertension Assessment & Plan: She is on amlodipine 10 mg and losartan  100 mg daily.  Adding HCTZ 12.5 mg daily.  Follow-up  in a month for comprehensive metabolic and blood pressure check  Orders: -     Comprehensive metabolic panel with GFR -     Lipid panel -     hydroCHLOROthiazide; Take 1 tablet (12.5 mg total) by mouth daily.  Dispense: 30 tablet; Refill: 3     Return in about 4 weeks (around 01/10/2024).    Ginelle Bays K Asiah Browder, MD

## 2024-01-16 DIAGNOSIS — A5901 Trichomonal vulvovaginitis: Secondary | ICD-10-CM | POA: Diagnosis not present

## 2024-01-16 DIAGNOSIS — Z113 Encounter for screening for infections with a predominantly sexual mode of transmission: Secondary | ICD-10-CM | POA: Diagnosis not present

## 2024-01-16 DIAGNOSIS — I10 Essential (primary) hypertension: Secondary | ICD-10-CM | POA: Diagnosis not present

## 2024-01-23 ENCOUNTER — Other Ambulatory Visit: Payer: Self-pay | Admitting: Family Medicine

## 2024-01-23 ENCOUNTER — Encounter: Payer: Self-pay | Admitting: Family Medicine

## 2024-01-23 ENCOUNTER — Ambulatory Visit (INDEPENDENT_AMBULATORY_CARE_PROVIDER_SITE_OTHER): Admitting: Family Medicine

## 2024-01-23 DIAGNOSIS — I10 Essential (primary) hypertension: Secondary | ICD-10-CM

## 2024-01-23 MED ORDER — WEGOVY 0.25 MG/0.5ML ~~LOC~~ SOAJ: 0.2500 mg | SUBCUTANEOUS | 0 refills | Status: DC

## 2024-01-23 NOTE — Assessment & Plan Note (Signed)
 She does not have diabetes as her A1c was 5.9%.  Will prescribe Wegovy or Zepbound.  Hopefully her insurance will cover it with the prior authorization.  Encouraged her to reduce carbohydrates and sugar in her diet and to exercise by walking daily.

## 2024-01-23 NOTE — Progress Notes (Signed)
 Established Patient Office Visit  Subjective   Patient ID: Sandra Berger, female    DOB: [birth year]  Age: 33 y.o. MRN: [id number]  Chief Complaint  Patient presents with   Medical Management of Chronic Issues    HPI Discussed the use of AI scribe software for clinical note transcription with the patient, who gave verbal consent to proceed.  History of Present Illness   Sandra Berger is a 33 year old female with hypertension who presents for blood pressure management.  She is currently taking HCTZ 12.5 mg, losartan  100 mg, and amlodipine 10 mg for hypertension. She has no issues with medication adherence but has not been monitoring her blood pressure at home.  She experiences difficulty tolerating heat, which she attributes to her medication. Recently, she attended an open house event for her daughter's school, where she had to stand outside in the heat for an extended period, leading to discomfort and mild swelling in her legs. The swelling is not severe but is noticeable, particularly with sock indentation.  She wants to lose weight and mentions that her aunt is on Ozempic. She finds it challenging to exercise due to the heat, stating that it is already in the seventies by 6 AM. She humorously notes that she gets some exercise by walking to the bathroom at work.   Reports mild swelling in her legs after prolonged standing in heat.          ROS    Objective:     BP 128/84 (BP Location: Left Arm, Patient Position: Sitting, Cuff Size: Large)   Pulse (!) 102   Temp 98.5 F (36.9 C) (Oral)   Resp 20   Ht 5' 2 (1.575 m)   Wt (!) 322 lb (146.1 kg)   SpO2 94%   BMI 58.89 kg/m    Physical Exam Vitals and nursing note reviewed.  Constitutional:      Appearance: Normal appearance.  HENT:     Head: Normocephalic and atraumatic.   Eyes:     Conjunctiva/sclera: Conjunctivae normal.    Cardiovascular:     Rate and Rhythm: Normal rate and regular rhythm.  Pulmonary:      Effort: Pulmonary effort is normal.     Breath sounds: Normal breath sounds.   Musculoskeletal:     Right lower leg: No edema (trace).     Left lower leg: No edema (trace).   Skin:    General: Skin is warm and dry.   Neurological:     Mental Status: She is alert and oriented to person, place, and time.   Psychiatric:        Mood and Affect: Mood normal.        Behavior: Behavior normal.        Thought Content: Thought content normal.        Judgment: Judgment normal.          No results found for any visits on 01/23/24.    The ASCVD Risk score (Arnett DK, et al., 2019) failed to calculate for the following reasons:   The 2019 ASCVD risk score is only valid for ages 68 to 72    Assessment & Plan:  Morbid obesity (HCC) Assessment & Plan: She does not have diabetes as her A1c was 5.9%.  Will prescribe Wegovy or Zepbound.  Hopefully her insurance will cover it with the prior authorization.  Encouraged her to reduce carbohydrates and sugar in her diet and to exercise by walking daily.  Orders: -  Wegovy; Inject 0.25 mg into the skin once a week. Use this dose for 1 month (4 shots) and then increase to next higher dose.  Dispense: 2 mL; Refill: 0  Primary hypertension Assessment & Plan: Blood pressure is well-controlled today on HCTZ 12.5 mg, losartan  50 twice daily and amlodipine 10 mg daily.  She has trace edema bilaterally.  Advised this will be worse if she gets out of the heat.  Please continue your medications.   Assessment and Plan    Hypertension Blood pressure improved to 139/85 mmHg. No adherence issues with current medications. Heat intolerance and mild leg swelling likely due to heat exposure. - Continue HCTZ 12.5 mg, losartan  100 mg, and amlodipine 10 mg. - Advise to avoid heat exposure. - Recheck blood pressure after resting.  Obesity Discussed Ozempic alternatives like Wegovy or Zepbound due to lack of diabetes. She is motivated for weight loss and  understands exercise and dietary changes are necessary. Insurance coverage for medication is uncertain. - Prescribe Wegovy or Zepbound if insurance approves. - Advise to contact [employer] for insurance coverage verification. - Encourage exercise and reduction of sugar and carbohydrate intake.         Return in about 4 weeks (around 02/20/2024).    Kaidyn Hernandes K Shakiyla Kook, MD

## 2024-01-23 NOTE — Assessment & Plan Note (Signed)
 Blood pressure is well-controlled today on HCTZ 12.5 mg, losartan  50 twice daily and amlodipine 10 mg daily.  She has trace edema bilaterally.  Advised this will be worse if she gets out of the heat.  Please continue your medications.

## 2024-01-26 ENCOUNTER — Ambulatory Visit (INDEPENDENT_AMBULATORY_CARE_PROVIDER_SITE_OTHER)

## 2024-01-26 ENCOUNTER — Ambulatory Visit: Payer: Self-pay

## 2024-01-26 DIAGNOSIS — N760 Acute vaginitis: Secondary | ICD-10-CM

## 2024-01-26 NOTE — Telephone Encounter (Signed)
 FYI Only or Action Required?: PT requesting medication for vaginal yeast infection called into pharmacy  Patient was last seen in primary care on 01/23/2024 by Ziglar, Susan K, MD. Called Nurse Triage reporting Vaginal Discharge, itching. Symptoms began several days ago. Interventions attempted: Other: Ate some yogurt. Symptoms are: unchanged.  Triage Disposition: See PCP When Office is Open (Within 3 Days)  Patient/caregiver understands and will follow disposition?: Yes - no pt wants medication called in.               Possible yeast infection. Itching, discharge. Pt would like medication to be sent to pharmacy  Reason for Disposition  [1] Symptoms of a yeast infection (i.e., itchy, white discharge, not bad smelling) AND [2] not improved > 3 days following Care Advice  Answer Assessment - Initial Assessment Questions 1. DISCHARGE: Describe the discharge. (e.g., white, yellow, green, gray, foamy, cottage cheese-like)     White - think 2. ODOR: Is there a bad odor?     No - fresh loaf of bread 3. ONSET: When did the discharge begin?     Saturday 4. RASH: Is there a rash in the genital area? If Yes, ask: Describe it. (e.g., redness, blisters, sores, bumps)     no 5. ABDOMEN PAIN: Are you having any abdomen pain? If Yes, ask: What does it feel like?  (e.g., crampy, dull, intermittent, constant)      Right - lower 6. ABDOMEN PAIN SEVERITY: If present, ask: How bad is it? (e.g., Scale 1-10; mild, moderate, or severe)   - MILD (1-3): Doesn't interfere with normal activities, abdomen soft and not tender to touch.    - MODERATE (4-7): Interferes with normal activities or awakens from sleep, abdomen tender to touch.    - SEVERE (8-10): Excruciating pain, doubled over, unable to do any normal activities. (R/O peritonitis)      2-3/10 7. CAUSE: What do you think is causing the discharge? Have you had the same problem before? What happened then?     Yeast  8. OTHER  SYMPTOMS: Do you have any other symptoms? (e.g., fever, itching, vaginal bleeding, pain with urination, injury to genital area, vaginal foreign body)     itching 9. PREGNANCY: Is there any chance you are pregnant? When was your last menstrual period?     no  Protocols used: Vaginal Discharge-A-AH

## 2024-01-26 NOTE — Telephone Encounter (Signed)
 Dr. Ziglar can you please advise.  Please send message to Rexene since I am leaving. Thank you.

## 2024-01-26 NOTE — Telephone Encounter (Signed)
 OV has been scheduled for 01/26/24 at 1:30.

## 2024-01-30 LAB — NUSWAB VAGINITIS PLUS (VG+)
Candida albicans, NAA: POSITIVE — AB
Candida glabrata, NAA: POSITIVE — AB
Chlamydia trachomatis, NAA: POSITIVE — AB
Neisseria gonorrhoeae, NAA: NEGATIVE
Trich vag by NAA: NEGATIVE

## 2024-01-31 ENCOUNTER — Ambulatory Visit: Payer: Self-pay

## 2024-01-31 ENCOUNTER — Ambulatory Visit: Payer: Self-pay | Admitting: Family Medicine

## 2024-01-31 ENCOUNTER — Other Ambulatory Visit: Payer: Self-pay | Admitting: Family Medicine

## 2024-01-31 DIAGNOSIS — N760 Acute vaginitis: Secondary | ICD-10-CM

## 2024-01-31 DIAGNOSIS — A749 Chlamydial infection, unspecified: Secondary | ICD-10-CM

## 2024-01-31 MED ORDER — DOXYCYCLINE HYCLATE 100 MG PO CAPS: 100.0000 mg | ORAL_CAPSULE | Freq: Two times a day (BID) | ORAL | 0 refills | Status: DC

## 2024-01-31 MED ORDER — FLUCONAZOLE 150 MG PO TABS: 150.0000 mg | ORAL_TABLET | Freq: Once | ORAL | 0 refills | Status: AC

## 2024-01-31 NOTE — Telephone Encounter (Signed)
 FYI Only or Action Required?: FYI only for provider.  Patient was last seen in primary care on 01/23/2024 by Ziglar, Susan K, MD. Called Nurse Triage reporting Advice Only.   Triage Disposition: Information or Advice Only Call  Patient/caregiver understands and will follow disposition?: Yes      Copied from CRM [id number]. Topic: Clinical - Lab/Test Results >> Jan 31, 2024  1:31 PM Tobias L wrote: Reason for CRM: Relayed results to patient. Patient has additional questions. Reason for Disposition  [1] Other NON-URGENT information for PCP AND [2] does not require PCP response  Answer Assessment - Initial Assessment Questions 1. REASON FOR CALL or QUESTION: What is your reason for calling today? or How can I best help you? or What question do you have that I can help answer?     Questioning about labs as she had recently finished a round of antibiotics.  Protocols used: PCP Call - No Triage-A-AH

## 2024-04-02 NOTE — Telephone Encounter (Signed)
 Patient needs appointment.

## 2024-04-05 ENCOUNTER — Telehealth: Payer: Self-pay

## 2024-04-05 ENCOUNTER — Other Ambulatory Visit: Payer: Self-pay | Admitting: Family Medicine

## 2024-04-05 DIAGNOSIS — I1A0 Resistant hypertension: Secondary | ICD-10-CM

## 2024-04-05 NOTE — Telephone Encounter (Signed)
(  Key: [id number])  Wegovy  0.25MG /0.5ML auto-injectors  Form Ambulance person [place] Form [phone] NCPDP)

## 2024-04-05 NOTE — Telephone Encounter (Signed)
 Copied from CRM [id number]. Topic: Clinical - Medication Question >> Apr 05, 2024  1:27 PM Montie POUR wrote: Reason for CRM:  [place] is checking on WEGOVY  0.25 MG/0.5ML SOAJ.  Has it been approved? Does she need to come in to get her weight before starting the medication? Please call her back at [phone]. Thanks

## 2024-04-08 NOTE — Telephone Encounter (Signed)
 Patient was informed that Wegovy  is not covered by plan. Patient verbalized understanding. All questions and concerns have been addressed.

## 2024-04-10 ENCOUNTER — Other Ambulatory Visit: Payer: Self-pay | Admitting: Family Medicine

## 2024-04-10 DIAGNOSIS — I1A0 Resistant hypertension: Secondary | ICD-10-CM

## 2024-04-10 MED ORDER — HYDROCHLOROTHIAZIDE 12.5 MG PO TABS: 12.5000 mg | ORAL_TABLET | Freq: Every day | ORAL | 3 refills | Status: DC

## 2024-04-10 NOTE — Telephone Encounter (Signed)
 Copied from CRM [id number]. Topic: Clinical - Medication Refill >> Apr 10, 2024  9:20 AM Rosaria E wrote: Medication: hydrochlorothiazide  (HYDRODIURIL ) 12.5 MG tablet  Has the patient contacted their pharmacy? Yes (Agent: If no, request that the patient contact the pharmacy for the refill. If patient does not wish to contact the pharmacy document the reason why and proceed with request.) (Agent: If yes, when and what did the pharmacy advise?)  This is the patient's preferred pharmacy:  [facility] [phone] [address] [place], [place] - [address] [place] [address] Phone: [phone] Fax: [phone]  Is this the correct pharmacy for this prescription? Yes If no, delete pharmacy and type the correct one.   Has the prescription been filled recently? Yes  Is the patient out of the medication? Yes  Has the patient been seen for an appointment in the last year OR does the patient have an upcoming appointment? Yes  Can we respond through MyChart? Yes  Agent: Please be advised that Rx refills may take up to 3 business days. We ask that you follow-up with your pharmacy.

## 2024-04-10 NOTE — Telephone Encounter (Signed)
 FYI Only or Action Required?: Action required by provider: medication refill request.  Patient was last seen in primary care on 01/23/2024 by Ziglar, Susan K, MD.  Called Nurse Triage reporting Medication Refill.  Symptoms began today.  Interventions attempted: Nothing.  Symptoms are: stable.  Triage Disposition: No disposition on file.  Patient/caregiver understands and will follow disposition?:

## 2024-04-15 ENCOUNTER — Encounter: Payer: Self-pay | Admitting: Family Medicine

## 2024-04-15 ENCOUNTER — Ambulatory Visit (INDEPENDENT_AMBULATORY_CARE_PROVIDER_SITE_OTHER): Admitting: Family Medicine

## 2024-04-15 VITALS — BP 133/85 | HR 100 | Temp 98.6°F | Resp 18 | Ht 62.0 in | Wt 327.0 lb

## 2024-04-15 DIAGNOSIS — I10 Essential (primary) hypertension: Secondary | ICD-10-CM | POA: Diagnosis not present

## 2024-04-15 NOTE — Progress Notes (Addendum)
 Established Patient Office Visit  Subjective   Patient ID: Sandra Berger, female    DOB: 10/09/1990  Age: 33 y.o. MRN: [id number]  Chief Complaint  Patient presents with   Medical Management of Chronic Issues    HPI Sandra Berger is a delightful 33 yo female with HTN and morbid obesity.  Her insurance company is not going to pay for a GLP-1 inhibitor.  She has gained 5 pounds since she was last seen.  Yesterday she ate almonds, chips and dip, sausage eggs on a bagel.  She also eats peanut butter and jelly sandwiches because they are convenient.  Discussed maybe not eating the jelly on the sandwich and maybe eating the peanut butter on an apple instead of bread.  Also discussed trying yogurt in the morning because it is high-protein and low calorie if you get the unsweetened varieties.  Discussed putting fruit like banana in it.  The problem is that she lives with her mother, her aunt, her 2 cousins and her 59-year-old daughter.  It is difficult to have good nutrition with such a variety of people.  It is also difficult for her to plan and prep meals.  Discussed referral to nutritionist today. She is on amlodipine 10 mg, HCTZ 12.5 mg and losartan  100 mg daily.  Her blood pressure was very well-controlled today at 133/85.  She reports no difficulty taking her blood pressure medication.    Discussed the Epworth Sleepiness Scale and she denies sleepiness during the day.  She does snore but denies waking up gasping for breath.  Also reports that no one states that she stops breathing during her sleep.  She reports that she is refreshed when she gets up in the morning if she has had a good night's rest.        ROS    Objective:     BP 133/85 (BP Location: Left Arm, Patient Position: Sitting, Cuff Size: Large)   Pulse 100   Temp 98.6 F (37 C) (Oral)   Resp 18   Ht 5' 2 (1.575 m)   Wt (!) 327 lb (148.3 kg)   LMP  (LMP Unknown)   SpO2 97%   BMI 59.81 kg/m    Physical Exam Vitals and  nursing note reviewed.  Constitutional:      Appearance: Normal appearance.  HENT:     Head: Normocephalic and atraumatic.  Eyes:     Conjunctiva/sclera: Conjunctivae normal.  Cardiovascular:     Rate and Rhythm: Normal rate and regular rhythm.  Pulmonary:     Effort: Pulmonary effort is normal.     Breath sounds: Normal breath sounds.  Musculoskeletal:     Right lower leg: No edema.     Left lower leg: No edema.  Skin:    General: Skin is warm and dry.  Neurological:     Mental Status: She is alert and oriented to person, place, and time.  Psychiatric:        Mood and Affect: Mood normal.        Behavior: Behavior normal.        Thought Content: Thought content normal.        Judgment: Judgment normal.          No results found for any visits on 04/15/24.    The ASCVD Risk score (Arnett DK, et al., 2019) failed to calculate for the following reasons:   The 2019 ASCVD risk score is only valid for ages 19 to 89  Assessment & Plan:  Primary hypertension Assessment & Plan: Good compliance with medication: HCTZ 12.5 mg, losartan  100 mg and amlodipine 10 mg daily.  Good control of her blood pressure with minimal side effects.   Morbid obesity (HCC) Assessment & Plan: Discussed trying to make more healthy choices and carb and sugar avoidance.      Return in about 3 months (around 07/15/2024).    Lorea Kupfer K Oliva Montecalvo, MD

## 2024-04-15 NOTE — Assessment & Plan Note (Signed)
 Good compliance with medication: HCTZ 12.5 mg, losartan  100 mg and amlodipine 10 mg daily.  Good control of her blood pressure with minimal side effects.

## 2024-04-15 NOTE — Assessment & Plan Note (Signed)
 Discussed trying to make more healthy choices and carb and sugar avoidance.

## 2024-04-17 ENCOUNTER — Encounter: Payer: Self-pay | Admitting: Emergency Medicine

## 2024-04-17 ENCOUNTER — Ambulatory Visit
Admission: EM | Admit: 2024-04-17 | Discharge: 2024-04-17 | Disposition: A | Discharge status: Home / Self Care | Attending: Physician Assistant | Admitting: Physician Assistant

## 2024-04-17 DIAGNOSIS — R109 Unspecified abdominal pain: Secondary | ICD-10-CM | POA: Diagnosis not present

## 2024-04-17 DIAGNOSIS — R051 Acute cough: Secondary | ICD-10-CM | POA: Diagnosis not present

## 2024-04-17 DIAGNOSIS — Z113 Encounter for screening for infections with a predominantly sexual mode of transmission: Secondary | ICD-10-CM | POA: Insufficient documentation

## 2024-04-17 DIAGNOSIS — M545 Low back pain, unspecified: Secondary | ICD-10-CM | POA: Diagnosis not present

## 2024-04-17 LAB — URINALYSIS, W/ REFLEX TO CULTURE (INFECTION SUSPECTED)
Bilirubin Urine: NEGATIVE
Glucose, UA: NEGATIVE mg/dL
Hgb urine dipstick: NEGATIVE
Ketones, ur: NEGATIVE mg/dL
Leukocytes,Ua: NEGATIVE
Nitrite: NEGATIVE
Protein, ur: NEGATIVE mg/dL
Specific Gravity, Urine: 1.02 (ref 1.005–1.030)
pH: 6.5 (ref 5.0–8.0)

## 2024-04-17 LAB — PREGNANCY, URINE: Preg Test, Ur: NEGATIVE

## 2024-04-17 LAB — SARS CORONAVIRUS 2 BY RT PCR: SARS Coronavirus 2 by RT PCR: NEGATIVE

## 2024-04-17 MED ORDER — CYCLOBENZAPRINE HCL 10 MG PO TABS: 10.0000 mg | ORAL_TABLET | Freq: Three times a day (TID) | ORAL | 0 refills | Status: AC | PRN

## 2024-04-17 MED ORDER — KETOROLAC TROMETHAMINE 60 MG/2ML IM SOLN
30.0000 mg | Freq: Once | INTRAMUSCULAR | Status: AC
  Administered 2024-04-17: 30 mg via INTRAMUSCULAR

## 2024-04-17 MED ORDER — NAPROXEN 500 MG PO TABS: 500.0000 mg | ORAL_TABLET | Freq: Two times a day (BID) | ORAL | 0 refills | Status: AC

## 2024-04-17 NOTE — ED Triage Notes (Signed)
 Pt c/o sweats, abdominal cramping, and vomiting since yesterday. She also has back pain x 5 days. She was seen by her PCP 2 days ago and was prescribed nausea medication.

## 2024-04-17 NOTE — Discharge Instructions (Addendum)
BACK PAIN: Stressed avoiding painful activities . RICE (REST, ICE, COMPRESSION, ELEVATION) guidelines reviewed. May alternate ice and heat. Consider use of muscle rubs, Salonpas patches, etc. Use medications as directed including muscle relaxers if prescribed. Take anti-inflammatory medications as prescribed or OTC NSAIDs/Tylenol.  F/u with PCP in 7-10 days for reexamination, and please feel free to call or return to the urgent care at any time for any questions or concerns you may have and we will be happy to help you!   BACK PAIN RED FLAGS: If the back pain acutely worsens or there are any red flag symptoms such as numbness/tingling, leg weakness, saddle anesthesia, or loss of bowel/bladder control, go immediately to the ER. Follow up with [place] as scheduled or sooner if the pain does not begin to resolve or if it worsens before the follow up    URI/COLD SYMPTOMS: Your exam today is consistent with a viral illness. Antibiotics are not indicated at this time. Use medications as directed, including cough syrup, nasal saline, and decongestants. Your symptoms should improve over the next few days and resolve within 7-10 days. Increase rest and fluids. F/u if symptoms worsen or predominate such as sore throat, ear pain, productive cough, shortness of breath, or if you develop high fevers or worsening fatigue over the next several days.

## 2024-04-17 NOTE — ED Provider Notes (Signed)
 [facility]    CSN: [id number] Arrival date & time: 04/17/24  1047      History   Chief Complaint Chief Complaint  Patient presents with   Abdominal Pain   Night Sweats   Back Pain    HPI Sandra Berger is a 33 y.o. female with history of asthma, obesity, and hypertension. She presents today for fatigue, increased anxiety/irritability, sweats and abdominal cramping that occurred 2 days ago.  The symptoms have resolved but now she is having lower back pain which stretches across the low back.  She states the back pain started after she stretched and felt a pop.  She has also had a slight cough.  Multiple family members have been sick with colds.  She denies fever, chest pain, palpitations, shortness of breath, abdominal pain, vomiting, diarrhea, blood in stool, shortness of breath, dysuria, frequency, urgency, vaginal discharge or odor.  Patient was diagnosed with chlamydia back in June and has not retested negative yet.  She has had 2 partners since she tested positive for chlamydia.  Has not been taking any medication over-the-counter for symptoms.  She is most bothered by her back pain at this time.  Back pain is worse with movement.  No other complaints.  HPI  Past Medical History:  Diagnosis Date   Asthma    Fracture, femur (HCC)    Hypertension     Patient Active Problem List   Diagnosis Date Noted   Noncompliance with medication regimen 11/29/2023   Hypertension 11/22/2023   Morbid obesity (HCC) 11/22/2023   Snores 11/22/2023    Past Surgical History:  Procedure Laterality Date   HIP SURGERY Left     OB History   No obstetric history on file.      Home Medications    Prior to Admission medications   Medication Sig Start Date End Date Taking? Authorizing Provider  cyclobenzaprine  (FLEXERIL ) 10 MG tablet Take 1 tablet (10 mg total) by mouth 3 (three) times daily as needed for muscle spasms. 04/17/24  Yes Arvis Jolan NOVAK, PA-C  naproxen  (NAPROSYN )  500 MG tablet Take 1 tablet (500 mg total) by mouth 2 (two) times daily. 04/17/24  Yes Arvis Jolan B, PA-C  amLODipine (NORVASC) 10 MG tablet Take 10 mg by mouth daily.    [provider]  doxycycline  (VIBRAMYCIN ) 100 MG capsule Take 1 capsule (100 mg total) by mouth 2 (two) times daily. 01/31/24   Ziglar, Susan K, MD  etonogestrel (NEXPLANON) 68 MG IMPL implant 68 mg by Subdermal route once. 01/12/21   [provider]  hydrochlorothiazide  (HYDRODIURIL ) 12.5 MG tablet Take 1 tablet (12.5 mg total) by mouth daily. 04/10/24   Ziglar, Susan K, MD  losartan  (COZAAR ) 100 MG tablet Take 1 tablet (100 mg total) by mouth daily. 11/29/23   Ziglar, Susan K, MD  losartan  (COZAAR ) 50 MG tablet Take 50 mg by mouth 2 (two) times daily.    [provider]  ondansetron  (ZOFRAN ) 4 MG tablet Take 1 tablet (4 mg total) by mouth every 6 (six) hours. 07/26/22   Rodriguez-Southworth, Kyra, PA-C  WEGOVY  0.25 MG/0.5ML SOAJ Inject 0.25 mg into the skin once a week. Use this dose for 1 month (4 shots) and then increase to next higher dose. 01/23/24   Ziglar, Susan K, MD  fluticasone  (FLONASE ) 50 MCG/ACT nasal spray Place 1 spray into both nostrils daily. 10/25/18 08/07/20  Arloa Ozell BIRCH, PA-C    Family History Family History  Problem Relation Age of Onset  Heart failure Mother    Irritable bowel syndrome Mother    Hypertension Mother    Anxiety disorder Mother    Stroke Mother    Diabetes Mother    Migraines Father     Social History Social History   Tobacco Use   Smoking status: Every Day    Types: Cigars    Passive exposure: Current   Smokeless tobacco: Never  Vaping Use   Vaping status: Never Used  Substance Use Topics   Alcohol use: Yes    Comment: occasionally   Drug use: Not Currently     Allergies   Penicillins and Percocet [oxycodone-acetaminophen ]   Review of Systems Review of Systems  Constitutional:  Positive for diaphoresis and fatigue. Negative for chills  and fever.  HENT:  Negative for congestion, rhinorrhea and sore throat.   Respiratory:  Positive for cough. Negative for shortness of breath.   Cardiovascular:  Negative for chest pain and palpitations.  Gastrointestinal:  Positive for abdominal pain, nausea and vomiting. Negative for constipation and diarrhea.  Genitourinary:  Negative for difficulty urinating, dysuria, flank pain, frequency, hematuria and vaginal discharge.  Musculoskeletal:  Positive for back pain. Negative for arthralgias and myalgias.  Skin:  Negative for rash.  Neurological:  Positive for headaches. Negative for dizziness and weakness.     Physical Exam Triage Vital Signs ED Triage Vitals  Encounter Vitals Group     BP 04/17/24 1200 139/85     Girls Systolic BP Percentile --      Girls Diastolic BP Percentile --      Boys Systolic BP Percentile --      Boys Diastolic BP Percentile --      Pulse Rate 04/17/24 1200 89     Resp 04/17/24 1200 18     Temp 04/17/24 1200 99.1 F (37.3 C)     Temp Source 04/17/24 1200 Oral     SpO2 04/17/24 1200 96 %     Weight --      Height --      Head Circumference --      Peak Flow --      Pain Score 04/17/24 1159 10     Pain Loc --      Pain Education --      Exclude from Growth Chart --    No data found.  Updated Vital Signs BP 139/85 (BP Location: Right Wrist)   Pulse 89   Temp 99.1 F (37.3 C) (Oral)   Resp 18   LMP  (LMP Unknown)   SpO2 96%   Physical Exam Vitals and nursing note reviewed.  Constitutional:      General: She is not in acute distress.    Appearance: Normal appearance. She is obese. She is not ill-appearing or toxic-appearing.  HENT:     Head: Normocephalic and atraumatic.     Nose: Nose normal.     Mouth/Throat:     Mouth: Mucous membranes are moist.     Pharynx: Oropharynx is clear.  Eyes:     General: No scleral icterus.       Right eye: No discharge.        Left eye: No discharge.     Conjunctiva/sclera: Conjunctivae normal.   Cardiovascular:     Rate and Rhythm: Normal rate and regular rhythm.     Heart sounds: Normal heart sounds.  Pulmonary:     Effort: Pulmonary effort is normal. No respiratory distress.     Breath sounds: Normal breath  sounds.  Abdominal:     Palpations: Abdomen is soft.     Tenderness: There is abdominal tenderness (LUQ). There is right CVA tenderness. There is no left CVA tenderness.  Musculoskeletal:     Cervical back: Neck supple.     Lumbar back: Tenderness (bilateral paravertebral) and bony tenderness (L5-S1) present. Decreased range of motion. Negative right straight leg raise test and negative left straight leg raise test.  Skin:    General: Skin is dry.  Neurological:     General: No focal deficit present.     Mental Status: She is alert. Mental status is at baseline.     Motor: No weakness.     Gait: Gait normal.  Psychiatric:        Mood and Affect: Mood normal.        Behavior: Behavior normal.      UC Treatments / Results  Labs (all labs ordered are listed, but only abnormal results are displayed) Labs Reviewed  URINALYSIS, W/ REFLEX TO CULTURE (INFECTION SUSPECTED) - Abnormal; Notable for the following components:      Result Value   Bacteria, UA FEW (*)    All other components within normal limits  SARS CORONAVIRUS 2 BY RT PCR  PREGNANCY, URINE  CERVICOVAGINAL ANCILLARY ONLY    EKG   Radiology No results found.  Procedures Procedures (including critical care time)  Medications Ordered in UC Medications  ketorolac  (TORADOL ) injection 30 mg (has no administration in time range)    Initial Impression / Assessment and Plan / UC Course  I have reviewed the triage vital signs and the nursing notes.  Pertinent labs & imaging results that were available during my care of the patient were reviewed by me and considered in my medical decision making (see chart for details).   33 year old female presents for low back pain that started yesterday.  Back  pain is worse with movement.  Reports having abdominal cramping, nausea without vomiting, headaches, sweats, fatigue and increased irritability a couple days ago.  Those symptoms have since resolved.  She complains of a slight cough but denies congestion, fever, sore throat.  Multiple sick family members with cold symptoms.  Reviewed PCP notes from 9/15 and lab/chlamydia + results from ED visit in June.  Vitals are all stable and normal and she is overall well-appearing.  Normal HEENT exam.  Chest clear.  Heart regular rate and rhythm.  Abdomen soft with mild tenderness of the left upper quadrant.  No guarding or rebound.  Subjective right CVA tenderness.  Tenderness of bilateral lumbar paravertebral muscles and L5-S1.  Reduced range of motion of back.  Urine pregnancy negative. Urinalysis not consistent with UTI. Patient performed vaginal self swab for GC/chlamydia and trichomonas. COVID test negative.  Viral illness and musculoskeletal lower back pain.  Supportive care urged with use of OTC meds.  Patient was given 30 mg IM ketorolac .  Sent naproxen  and cyclobenzaprine  to pharmacy.  Encouraged use of warm compresses, increasing rest and fluids. Reviewed ED precautions. Work note given.    Final Clinical Impressions(s) / UC Diagnoses   Final diagnoses:  Acute bilateral low back pain without sciatica  Abdominal cramping  Screening examination for sexually transmitted disease  Acute cough     Discharge Instructions      BACK PAIN: Stressed avoiding painful activities . RICE (REST, ICE, COMPRESSION, ELEVATION) guidelines reviewed. May alternate ice and heat. Consider use of muscle rubs, Salonpas patches, etc. Use medications as directed including muscle relaxers if  prescribed. Take anti-inflammatory medications as prescribed or OTC NSAIDs/Tylenol .  F/u with PCP in 7-10 days for reexamination, and please feel free to call or return to the urgent care at any time for any questions or concerns  you may have and we will be happy to help you!   BACK PAIN RED FLAGS: If the back pain acutely worsens or there are any red flag symptoms such as numbness/tingling, leg weakness, saddle anesthesia, or loss of bowel/bladder control, go immediately to the ER. Follow up with us  as scheduled or sooner if the pain does not begin to resolve or if it worsens before the follow up    URI/COLD SYMPTOMS: Your exam today is consistent with a viral illness. Antibiotics are not indicated at this time. Use medications as directed, including cough syrup, nasal saline, and decongestants. Your symptoms should improve over the next few days and resolve within 7-10 days. Increase rest and fluids. F/u if symptoms worsen or predominate such as sore throat, ear pain, productive cough, shortness of breath, or if you develop high fevers or worsening fatigue over the next several days.       ED Prescriptions     Medication Sig Dispense Auth. Provider   naproxen  (NAPROSYN ) 500 MG tablet Take 1 tablet (500 mg total) by mouth 2 (two) times daily. 30 tablet Arvis Huxley B, PA-C   cyclobenzaprine  (FLEXERIL ) 10 MG tablet Take 1 tablet (10 mg total) by mouth 3 (three) times daily as needed for muscle spasms. 20 tablet Esmae Donathan B, PA-C      PDMP not reviewed this encounter.   Arvis Huxley NOVAK, PA-C 04/17/24 1310

## 2024-04-18 LAB — CERVICOVAGINAL ANCILLARY ONLY
Chlamydia: NEGATIVE
Comment: NEGATIVE
Comment: NEGATIVE
Comment: NORMAL
Neisseria Gonorrhea: NEGATIVE
Trichomonas: NEGATIVE

## 2024-05-13 ENCOUNTER — Other Ambulatory Visit: Payer: Self-pay | Admitting: Family Medicine

## 2024-05-13 DIAGNOSIS — I1A0 Resistant hypertension: Secondary | ICD-10-CM

## 2024-05-13 MED ORDER — HYDROCHLOROTHIAZIDE 12.5 MG PO TABS: 12.5000 mg | ORAL_TABLET | Freq: Every day | ORAL | 3 refills | Status: DC

## 2024-05-13 MED ORDER — LOSARTAN POTASSIUM 50 MG PO TABS: 50.0000 mg | ORAL_TABLET | Freq: Two times a day (BID) | ORAL | 0 refills | Status: DC

## 2024-05-13 MED ORDER — AMLODIPINE BESYLATE 10 MG PO TABS: 10.0000 mg | ORAL_TABLET | Freq: Every day | ORAL | 0 refills | Status: DC

## 2024-05-13 NOTE — Telephone Encounter (Unsigned)
 Copied from CRM [id number]. Topic: Clinical - Medication Refill >> May 13, 2024 12:40 PM Ivette P wrote: Medication:   losartan  (COZAAR ) 50 MG tablet   amLODipine (NORVASC) 10 MG tablet   hydrochlorothiazide  (HYDRODIURIL ) 12.5 MG tablet  Has the patient contacted their pharmacy? Yes (Agent: If no, request that the patient contact the pharmacy for the refill. If patient does not wish to contact the pharmacy document the reason why and proceed with request.) (Agent: If yes, when and what did the pharmacy advise?)  This is the patient's preferred pharmacy:  [facility] [phone] [address] [place], [place] - [address] [place] [address] Phone: [phone] Fax: [phone]  Is this the correct pharmacy for this prescription? Yes If no, delete pharmacy and type the correct one.   Has the prescription been filled recently? No  Is the patient out of the medication? Yes  Has the patient been seen for an appointment in the last year OR does the patient have an upcoming appointment? Yes  Can we respond through MyChart? No  Agent: Please be advised that Rx refills may take up to 3 business days. We ask that you follow-up with your pharmacy.

## 2024-06-08 ENCOUNTER — Other Ambulatory Visit: Payer: Self-pay | Admitting: Family Medicine

## 2024-06-10 ENCOUNTER — Other Ambulatory Visit: Payer: Self-pay

## 2024-06-10 DIAGNOSIS — I1A0 Resistant hypertension: Secondary | ICD-10-CM

## 2024-06-10 MED ORDER — LOSARTAN POTASSIUM 100 MG PO TABS: 100.0000 mg | ORAL_TABLET | Freq: Every day | ORAL | 0 refills | Status: DC

## 2024-07-15 ENCOUNTER — Ambulatory Visit: Admitting: Family Medicine

## 2024-07-15 ENCOUNTER — Encounter: Payer: Self-pay | Admitting: Family Medicine

## 2024-07-15 VITALS — BP 133/81 | HR 108 | Temp 98.0°F | Ht 62.0 in | Wt 321.0 lb

## 2024-07-15 DIAGNOSIS — M545 Low back pain, unspecified: Secondary | ICD-10-CM | POA: Diagnosis not present

## 2024-07-15 DIAGNOSIS — R0683 Snoring: Secondary | ICD-10-CM | POA: Diagnosis not present

## 2024-07-15 DIAGNOSIS — I1A0 Resistant hypertension: Secondary | ICD-10-CM

## 2024-07-15 MED ORDER — AMLODIPINE BESYLATE 10 MG PO TABS: 10.0000 mg | ORAL_TABLET | Freq: Every day | ORAL | 1 refills | Status: AC

## 2024-07-15 MED ORDER — HYDROCHLOROTHIAZIDE 12.5 MG PO TABS: 12.5000 mg | ORAL_TABLET | Freq: Every day | ORAL | 1 refills | Status: AC

## 2024-07-15 MED ORDER — LOSARTAN POTASSIUM 100 MG PO TABS: 100.0000 mg | ORAL_TABLET | Freq: Every day | ORAL | 1 refills | Status: AC

## 2024-07-15 MED ORDER — TIZANIDINE HCL 4 MG PO TABS: 4.0000 mg | ORAL_TABLET | Freq: Every evening | ORAL | 2 refills | Status: AC

## 2024-07-15 NOTE — Assessment & Plan Note (Signed)
 Blood pressure is much better controlled at 133/81 today.  Please continue losartan  100 mg and HCTZ 12.5 mg in the morning.  Take your amlodipine  10 mg in the afternoon.

## 2024-07-15 NOTE — Assessment & Plan Note (Signed)
 Trial tizanidine  4 mg 3 times a day for back pain.  Should not make you sleepy as cyclobenzaprine .

## 2024-07-15 NOTE — Assessment & Plan Note (Signed)
 She notes that she wakes up gasping.  Will send to pulmonology for sleep apnea assessment

## 2024-07-15 NOTE — Progress Notes (Signed)
 Established Patient Office Visit  Subjective   Patient ID: Sandra Berger, female    DOB: 02-10-1991  Age: 33 y.o. MRN: [id number]  Chief Complaint  Patient presents with   Follow-up    HPI Delightful 33 year old female with HTN, morbid obesity depression and anxiety. She is down because she cannot get a GLP-1 inhibitor her insurance will not pay for this.  She reports she is having anxiety and depression but denies suicidal and homicidal ideation.  She does not want to take medication but she does want a psychological referral.  She like to talk to somebody about what is going on with her. She is working nights and she has lost 6 pounds since she was last here.  Her job is rather physical and it is 10 to 12 hours at a time. She has developed a little bit of back pain on the left lumbar side no radiculopathy she has been taking Flexeril  10 mg but she cannot take it before she goes to work because it makes her sleepy. Blood pressure is well-controlled today at 133/81 she is on losartan  100 mg HCTZ 12.5 and amlodipine  10 mg daily.  She feels that she takes all of her medicine at once in the morning that it wears off overnight.  Have advised that it should not.         Objective:     BP 133/81   Pulse (!) 108   Temp 98 F (36.7 C) (Oral)   Ht 5' 2 (1.575 m)   Wt (!) 321 lb (145.6 kg)   SpO2 97%   BMI 58.71 kg/m    Physical Exam Vitals and nursing note reviewed.  Constitutional:      Appearance: Normal appearance.  HENT:     Head: Normocephalic and atraumatic.  Eyes:     Conjunctiva/sclera: Conjunctivae normal.  Cardiovascular:     Rate and Rhythm: Normal rate and regular rhythm.  Pulmonary:     Effort: Pulmonary effort is normal.     Breath sounds: Normal breath sounds.  Musculoskeletal:     Right lower leg: No edema.     Left lower leg: No edema.  Skin:    General: Skin is warm and dry.  Neurological:     Mental Status: She is alert and oriented to person, place,  and time.  Psychiatric:        Mood and Affect: Mood normal.        Behavior: Behavior normal.        Thought Content: Thought content normal.        Judgment: Judgment normal.          No results found for any visits on 07/15/24.    The ASCVD Risk score (Arnett DK, et al., 2019) failed to calculate for the following reasons:   The 2019 ASCVD risk score is only valid for ages 81 to 25    Assessment & Plan:  Lumbar pain Assessment & Plan: Trial tizanidine  4 mg 3 times a day for back pain.  Should not make you sleepy as cyclobenzaprine .  Orders: -     tiZANidine  HCl; Take 1 tablet (4 mg total) by mouth Nightly.  Dispense: 30 tablet; Refill: 2  Resistant hypertension Assessment & Plan: Blood pressure is much better controlled at 133/81 today.  Please continue losartan  100 mg and HCTZ 12.5 mg in the morning.  Take your amlodipine  10 mg in the afternoon.  Orders: -     hydroCHLOROthiazide ; Take 1  tablet (12.5 mg total) by mouth daily.  Dispense: 90 tablet; Refill: 1 -     Losartan  Potassium; Take 1 tablet (100 mg total) by mouth daily.  Dispense: 90 tablet; Refill: 1 -     amLODIPine  Besylate; Take 1 tablet (10 mg total) by mouth daily.  Dispense: 90 tablet; Refill: 1  Snores Assessment & Plan: She notes that she wakes up gasping.  Will send to pulmonology for sleep apnea assessment      Return in about 3 months (around 10/13/2024).    Ellanie Oppedisano K Jenissa Tyrell, MD

## 2024-07-23 ENCOUNTER — Ambulatory Visit: Admitting: Sleep Medicine

## 2024-07-23 ENCOUNTER — Encounter: Payer: Self-pay | Admitting: Sleep Medicine

## 2024-07-23 ENCOUNTER — Ambulatory Visit (INDEPENDENT_AMBULATORY_CARE_PROVIDER_SITE_OTHER): Admitting: Sleep Medicine

## 2024-07-23 VITALS — BP 110/70 | HR 93 | Temp 99.0°F | Ht 62.0 in | Wt 319.4 lb

## 2024-07-23 DIAGNOSIS — G4726 Circadian rhythm sleep disorder, shift work type: Secondary | ICD-10-CM

## 2024-07-23 DIAGNOSIS — I10 Essential (primary) hypertension: Secondary | ICD-10-CM

## 2024-07-23 DIAGNOSIS — Z6841 Body Mass Index (BMI) 40.0 and over, adult: Secondary | ICD-10-CM | POA: Diagnosis not present

## 2024-07-23 DIAGNOSIS — G4733 Obstructive sleep apnea (adult) (pediatric): Secondary | ICD-10-CM | POA: Diagnosis not present

## 2024-07-23 NOTE — Progress Notes (Signed)
 "      Name:Sandra Berger MRN: [id number] DOB: 1990/10/19   CHIEF COMPLAINT:  EXCESSIVE DAYTIME SLEEPINESS   HISTORY OF PRESENT ILLNESS: Sandra Berger is a 33 y.o. w/ a h/o HTN and morbid obesity who presents for c/o loud snoring and witnessed apnea which has been present for several years. Reports nocturnal awakenings due to nocturia, however does not have difficulty falling back to sleep. Reports significant weight changes. Admits to night sweats. Denies morning headaches, RLS symptoms, dream enactment, cataplexy, hypnagogic or hypnapompic hallucinations. Reports a family history of sleep apnea. Denies drowsy driving. Drinks 2 cups of coffee and 2-3 sodas daily, occasional alcohol use, vapes nicotine throughout the day, uses marijuana daily. Patient works at [employer] from 8:30 pm-7 am 4 days per week.    Bedtime 8:30-9 am Sleep onset 10 mins Rise time 12-1 pm    EPWORTH SLEEP SCORE 2    07/23/2024   10:00 AM  Results of the Epworth flowsheet  Sitting and reading 0  Watching TV 0  Sitting, inactive in a public place (e.g. a theatre or a meeting) 0  As a passenger in a car for an hour without a break 0  Lying down to rest in the afternoon when circumstances permit 0  Sitting and talking to someone 0  Sitting quietly after a lunch without alcohol 2  In a car, while stopped for a few minutes in traffic 0  Total score 2    PAST MEDICAL HISTORY :   has a past medical history of Asthma, Fracture, femur (HCC), and Hypertension.  has a past surgical history that includes Hip surgery (Left). Prior to Admission medications  Medication Sig Start Date End Date Taking? Authorizing Provider  amLODipine  (NORVASC ) 10 MG tablet Take 1 tablet (10 mg total) by mouth daily. 07/15/24  Yes Ziglar, Susan K, MD  cyclobenzaprine  (FLEXERIL ) 10 MG tablet Take 1 tablet (10 mg total) by mouth 3 (three) times daily as needed for muscle spasms. 04/17/24  Yes Arvis Jolan NOVAK, PA-C  etonogestrel (NEXPLANON) 68 MG  IMPL implant 68 mg by Subdermal route once. 01/12/21  Yes [provider]  hydrochlorothiazide  (HYDRODIURIL ) 12.5 MG tablet Take 1 tablet (12.5 mg total) by mouth daily. 07/15/24  Yes Ziglar, Susan K, MD  losartan  (COZAAR ) 100 MG tablet Take 1 tablet (100 mg total) by mouth daily. 07/15/24  Yes Ziglar, Susan K, MD  naproxen  (NAPROSYN ) 500 MG tablet Take 1 tablet (500 mg total) by mouth 2 (two) times daily. 04/17/24  Yes Arvis Jolan NOVAK, PA-C  ondansetron  (ZOFRAN ) 4 MG tablet Take 1 tablet (4 mg total) by mouth every 6 (six) hours. 07/26/22  Yes Rodriguez-Southworth, Sylvia, PA-C  tiZANidine  (ZANAFLEX ) 4 MG tablet Take 1 tablet (4 mg total) by mouth Nightly. 07/15/24  Yes Ziglar, Susan K, MD  fluticasone  (FLONASE ) 50 MCG/ACT nasal spray Place 1 spray into both nostrils daily. 10/25/18 08/07/20  Arloa Ozell BIRCH, PA-C   Allergies[1]  FAMILY HISTORY:  family history includes Anxiety disorder in her mother; Diabetes in her mother; Heart failure in her mother; Hypertension in her mother; Irritable bowel syndrome in her mother; Migraines in her father; Stroke in her mother. SOCIAL HISTORY:  reports that she has been smoking cigars. She has been exposed to tobacco smoke. She has never used smokeless tobacco. She reports current alcohol use. She reports that she does not currently use drugs.   Review of Systems:  Gen:  Denies  fever, sweats, chills weight loss  HEENT: Denies  blurred vision, double vision, ear pain, eye pain, hearing loss, nose bleeds, sore throat Cardiac:  No dizziness, chest pain or heaviness, chest tightness,edema, No JVD Resp:   No cough, -sputum production, -shortness of breath,-wheezing, -hemoptysis,  Gi: Denies swallowing difficulty, stomach pain, nausea or vomiting, diarrhea, constipation, bowel incontinence Gu:  Denies bladder incontinence, burning urine Ext:   Denies Joint pain, stiffness or swelling Skin: Denies  skin rash, easy bruising or bleeding or hives Endoc:   Denies polyuria, polydipsia , polyphagia or weight change Psych:   Denies depression, insomnia or hallucinations  Other:  All other systems negative  VITAL SIGNS: BP 110/70   Pulse 93   Temp 99 F (37.2 C)   Ht 5' 2 (1.575 m)   Wt (!) 319 lb 6.4 oz (144.9 kg)   LMP  (LMP Unknown)   SpO2 99%   BMI 58.42 kg/m    Physical Examination:   General Appearance: No distress  EYES PERRLA, EOM intact.   NECK Supple, No JVD Pulmonary: normal breath sounds, No wheezing.  CardiovascularNormal S1,S2.  No m/r/g.   Abdomen: Benign, Soft, non-tender. Skin:   warm, no rashes, no ecchymosis  Extremities: normal, no cyanosis, clubbing. Neuro:without focal findings,  speech normal  PSYCHIATRIC: Mood, affect within normal limits.   ASSESSMENT AND PLAN  OSA I suspect that OSA is likely present due to clinical presentation. Discussed the consequences of untreated sleep apnea. Advised not to drive drowsy for safety of patient and others. Will complete further evaluation with a home sleep study and follow up to review results.    Shift work sleep disorder Counseled patient on keeping a regular sleep schedule on all days. Also counseled on improving sleep hygiene practices and the importance of sleeping 7-8 hours per night.   HTN Stable, on current management. Following with PCP.   Morbid obesity Counseled patient on diet and lifestyle modification.    MEDICATION ADJUSTMENTS/LABS AND TESTS ORDERED: Recommend Sleep Study   Patient  satisfied with Plan of action and management. All questions answered  Follow up to review HST results and treatment plan.   I spent a total of 30 minutes reviewing chart data, face-to-face evaluation with the patient, counseling and coordination of care as detailed above.    Levis Nazir, M.D.  Sleep Medicine [facility]           [1]  Allergies Allergen Reactions   Penicillins     itching   Percocet  Emmet.erp ] Itching   "

## 2024-07-23 NOTE — Patient Instructions (Addendum)
 SABRA

## 2024-08-06 ENCOUNTER — Telehealth: Payer: Self-pay

## 2024-08-06 DIAGNOSIS — G4733 Obstructive sleep apnea (adult) (pediatric): Secondary | ICD-10-CM

## 2024-08-06 NOTE — Telephone Encounter (Signed)
 Copied from CRM [id number]. Topic: Clinical - Request for Lab/Test Order >> Aug 06, 2024 11:34 AM Sandra Berger wrote: Reason for CRM: Pt called and stated she was advised to do a home sleep study and she stated she will not be able to complete the test because she will remove the necessary equipment to do the test and would like to do the sleep study inpatient at the hospital, pt would like a callback regarding this.

## 2024-08-06 NOTE — Telephone Encounter (Signed)
 I spoke with the patient. She said she received the [employer] home sleep study in the mail. After reading the instructions she does not think she will be able to do the HST. She said she knows she will take it off and not finish the test. She is asking if you will order the in lab sleep study instead?

## 2024-08-08 NOTE — Telephone Encounter (Signed)
 Order for inlab sleep study has been placed.

## 2024-08-08 NOTE — Telephone Encounter (Signed)
 I have notified the patient. Nothing further needed.

## 2024-08-20 ENCOUNTER — Encounter: Payer: Self-pay | Admitting: *Deleted

## 2024-08-20 ENCOUNTER — Other Ambulatory Visit: Payer: Self-pay

## 2024-08-20 DIAGNOSIS — E876 Hypokalemia: Secondary | ICD-10-CM | POA: Diagnosis not present

## 2024-08-20 DIAGNOSIS — R519 Headache, unspecified: Secondary | ICD-10-CM | POA: Insufficient documentation

## 2024-08-20 NOTE — ED Triage Notes (Addendum)
 Pt ambulatory to triage.  Pt has a headache for 2-3 weeks.  No dizziness   No otc meds.  intermittent pain.  Pt alert   speech clear.

## 2024-08-21 ENCOUNTER — Emergency Department

## 2024-08-21 ENCOUNTER — Emergency Department
Admission: EM | Admit: 2024-08-21 | Discharge: 2024-08-21 | Disposition: A | Attending: Emergency Medicine | Admitting: Emergency Medicine

## 2024-08-21 DIAGNOSIS — R519 Headache, unspecified: Secondary | ICD-10-CM

## 2024-08-21 LAB — BASIC METABOLIC PANEL WITH GFR
Anion gap: 9 (ref 5–15)
BUN: 9 mg/dL (ref 6–20)
CO2: 26 mmol/L (ref 22–32)
Calcium: 8.3 mg/dL — ABNORMAL LOW (ref 8.9–10.3)
Chloride: 105 mmol/L (ref 98–111)
Creatinine, Ser: 0.66 mg/dL (ref 0.44–1.00)
GFR, Estimated: >60 mL/min
Glucose, Bld: 88 mg/dL (ref 70–99)
Potassium: 3.3 mmol/L — ABNORMAL LOW (ref 3.5–5.1)
Sodium: 140 mmol/L (ref 135–145)

## 2024-08-21 LAB — CBC WITH DIFFERENTIAL/PLATELET
Abs Immature Granulocytes: 0.02 K/uL (ref 0.00–0.07)
Basophils Absolute: 0 K/uL (ref 0.0–0.1)
Basophils Relative: 0 %
Eosinophils Absolute: 0.2 K/uL (ref 0.0–0.5)
Eosinophils Relative: 2 %
HCT: 39.7 % (ref 36.0–46.0)
Hemoglobin: 13 g/dL (ref 12.0–15.0)
Immature Granulocytes: 0 %
Lymphocytes Relative: 54 %
Lymphs Abs: 4.7 K/uL — ABNORMAL HIGH (ref 0.7–4.0)
MCH: 29.3 pg (ref 26.0–34.0)
MCHC: 32.7 g/dL (ref 30.0–36.0)
MCV: 89.6 fL (ref 80.0–100.0)
Monocytes Absolute: 0.8 K/uL (ref 0.1–1.0)
Monocytes Relative: 9 %
Neutro Abs: 3.1 K/uL (ref 1.7–7.7)
Neutrophils Relative %: 35 %
Platelets: 390 K/uL (ref 150–400)
RBC: 4.43 MIL/uL (ref 3.87–5.11)
RDW: 13.7 % (ref 11.5–15.5)
WBC: 8.8 K/uL (ref 4.0–10.5)
nRBC: 0 % (ref 0.0–0.2)

## 2024-08-21 MED ORDER — KETOROLAC TROMETHAMINE 15 MG/ML IJ SOLN
15.0000 mg | Freq: Once | INTRAMUSCULAR | Status: AC
  Administered 2024-08-21: 15 mg via INTRAVENOUS
  Filled 2024-08-21: qty 1

## 2024-08-21 MED ORDER — ACETAMINOPHEN 500 MG PO TABS
1000.0000 mg | ORAL_TABLET | Freq: Once | ORAL | Status: AC
  Administered 2024-08-21: 1000 mg via ORAL
  Filled 2024-08-21: qty 2

## 2024-08-21 MED ORDER — IOHEXOL 350 MG/ML SOLN
75.0000 mL | Freq: Once | INTRAVENOUS | Status: AC | PRN
  Administered 2024-08-21: 75 mL via INTRAVENOUS

## 2024-08-21 NOTE — ED Provider Notes (Signed)
 "  [facility] Provider Note    Event Date/Time   First MD Initiated Contact with Patient 08/21/24 0028     (approximate)   History   Headache   HPI  Sandra Berger is a 34 y.o. female   Past medical history of no significant past medical history who presents to Emergency Department with Intermittent headache for 2 to 3 weeks.  Of note she has recently switched to night shift work, but this was dating back several months. Otherwise denies any trauma, medication changes, or any obvious inciting or alleviating factors.  There is no temporal nature to the headache, it can happen anytime, during any activity, and is experienced as a viselike sensation across the head and at times can feel like it goes down the neck.  Denies vision changes any motor or sensory changes or dizziness.  No other associated medical complaints.  External Medical Documents Reviewed: Previous outpatient notes      Physical Exam   Triage Vital Signs: ED Triage Vitals  Encounter Vitals Group     BP 08/20/24 2112 125/80     Girls Systolic BP Percentile --      Girls Diastolic BP Percentile --      Boys Systolic BP Percentile --      Boys Diastolic BP Percentile --      Pulse Rate 08/20/24 2112 96     Resp 08/20/24 2112 20     Temp 08/20/24 2112 99.4 F (37.4 C)     Temp Source 08/20/24 2112 Oral     SpO2 08/20/24 2112 100 %     Weight 08/20/24 2110 (!) 319 lb (144.7 kg)     Height 08/20/24 2110 5' 2 (1.575 m)     Head Circumference --      Peak Flow --      Pain Score 08/20/24 2110 10     Pain Loc --      Pain Education --      Exclude from Growth Chart --     Most recent vital signs: Vitals:   08/21/24 0213 08/21/24 0433  BP:  133/78  Pulse:  87  Resp:  17  Temp: 98.3 F (36.8 C)   SpO2:  100%    General: Awake, no distress.  CV:  Good peripheral perfusion.  Resp:  Normal effort.  Abd:  No distention.  Other:  Neck supple full range of motion and is  comfortable nontoxic-appearing pleasant woman.  Normal vital signs.  No signs of trauma.  No focal neurologic deficits including facial asymmetry dysarthria motor or sensory changes or finger-nose coordination.   ED Results / Procedures / Treatments   Labs (all labs ordered are listed, but only abnormal results are displayed) Labs Reviewed  CBC WITH DIFFERENTIAL/PLATELET - Abnormal; Notable for the following components:      Result Value   Lymphs Abs 4.7 (*)    All other components within normal limits  BASIC METABOLIC PANEL WITH GFR - Abnormal; Notable for the following components:   Potassium 3.3 (*)    Calcium 8.3 (*)    All other components within normal limits     I ordered and reviewed the above labs they are notable for cell counts and electrolytes unremarkable.   RADIOLOGY I independently reviewed and interpreted CT head and see no obvious bleeding or midline shift I also reviewed radiologist's formal read.   PROCEDURES:  Critical Care performed: No  Procedures   MEDICATIONS ORDERED IN ED:  Medications  ketorolac  (TORADOL ) 15 MG/ML injection 15 mg (15 mg Intravenous Given 08/21/24 0210)  acetaminophen  (TYLENOL ) tablet 1,000 mg (1,000 mg Oral Given 08/21/24 0209)  iohexol  (OMNIPAQUE ) 350 MG/ML injection 75 mL (75 mLs Intravenous Contrast Given 08/21/24 0243)    IMPRESSION / MDM / ASSESSMENT AND PLAN / ED COURSE  I reviewed the triage vital signs and the nursing notes.                                Patient's presentation is most consistent with acute presentation with potential threat to life or bodily function.  Differential diagnosis includes, but is not limited to, dissection, head bleed, migraine, tension type headache, considered but less likely meningitis or thromboses   MDM:     Intermittent bandlike headache with no obvious inciting or alleviating factors happening for 20 to 30 minutes at a time over the last 2 to 3 weeks.  Atraumatic, not associated  with any infectious symptoms, doubt head bleed or meningitis.  Given some pain that runs down the right side of her neck I did consider dissection so a CT angiogram of the head and neck which was unremarkable.  Considered stroke but without focal neurologic deficits I think highly unlikely.  I am not sure what is causing her headache but given her unremarkable evaluation today and largely asymptomatic currently, I doubt neurologic life-threatening emergency at this time and so though I did consider hospitalization, I think she is more appropriate for outpatient follow-up with neurologist and PMD.  I gave her strict return precautions.       FINAL CLINICAL IMPRESSION(S) / ED DIAGNOSES   Final diagnoses:  Nonintractable episodic headache, unspecified headache type     Rx / DC Orders   ED Discharge Orders     None        Note:  This document was prepared using Dragon voice recognition software and may include unintentional dictation errors.    Cyrena Mylar, MD 08/21/24 [phone]  "

## 2024-08-21 NOTE — ED Notes (Signed)
Patient verbalizes understanding of discharge instructions. Opportunity for questioning and answers were provided. Armband removed by staff, pt discharged from ED. Ambulated out to lobby  

## 2024-08-21 NOTE — Discharge Instructions (Addendum)
 Fortunately your CT angiogram of the head and neck did not show any emergency conditions that explain your headache.  Take acetaminophen  650 mg and ibuprofen  400 mg every 6 hours for pain.  Take with food.  Call Dr. Maree who is a headache specialist for follow-up appointment.  Thank you for choosing us  for your health care today!  Please see your primary doctor this week for a follow up appointment.   If you have any new, worsening, or unexpected symptoms call your doctor right away or come back to the emergency department for reevaluation.  It was my pleasure to care for you today.   Ginnie EDISON Cyrena, MD

## 2024-10-14 ENCOUNTER — Ambulatory Visit: Admitting: Family Medicine
